# Patient Record
Sex: Male | Born: 1960 | Race: White | Hispanic: No | Marital: Single | Smoking: Never smoker
Health system: Southern US, Community
[De-identification: ages and names within clinical notes are randomized; demographics above are authoritative.]

## PROBLEM LIST (undated history)

## (undated) DIAGNOSIS — E559 Vitamin D deficiency, unspecified: Secondary | ICD-10-CM

## (undated) DIAGNOSIS — N200 Calculus of kidney: Secondary | ICD-10-CM

## (undated) DIAGNOSIS — E349 Endocrine disorder, unspecified: Secondary | ICD-10-CM

## (undated) DIAGNOSIS — T7840XA Allergy, unspecified, initial encounter: Secondary | ICD-10-CM

## (undated) DIAGNOSIS — I10 Essential (primary) hypertension: Secondary | ICD-10-CM

## (undated) DIAGNOSIS — E785 Hyperlipidemia, unspecified: Secondary | ICD-10-CM

## (undated) HISTORY — PX: BASAL CELL CARCINOMA EXCISION: SHX1214

## (undated) HISTORY — DX: Essential (primary) hypertension: I10

## (undated) HISTORY — DX: Calculus of kidney: N20.0

## (undated) HISTORY — DX: Vitamin D deficiency, unspecified: E55.9

## (undated) HISTORY — DX: Hyperlipidemia, unspecified: E78.5

## (undated) HISTORY — DX: Allergy, unspecified, initial encounter: T78.40XA

## (undated) HISTORY — DX: Endocrine disorder, unspecified: E34.9

---

## 1997-10-16 HISTORY — PX: WISDOM TOOTH EXTRACTION: SHX21

## 2007-09-02 ENCOUNTER — Ambulatory Visit (HOSPITAL_COMMUNITY): Admission: RE | Admit: 2007-09-02 | Discharge: 2007-09-02 | Payer: Self-pay | Admitting: Internal Medicine

## 2007-10-03 ENCOUNTER — Ambulatory Visit (HOSPITAL_COMMUNITY): Admission: RE | Admit: 2007-10-03 | Discharge: 2007-10-03 | Payer: Self-pay | Admitting: Internal Medicine

## 2008-03-24 ENCOUNTER — Emergency Department (HOSPITAL_BASED_OUTPATIENT_CLINIC_OR_DEPARTMENT_OTHER): Admission: EM | Admit: 2008-03-24 | Discharge: 2008-03-25 | Payer: Self-pay | Admitting: Emergency Medicine

## 2010-01-22 ENCOUNTER — Ambulatory Visit: Payer: Self-pay | Admitting: Diagnostic Radiology

## 2010-01-22 ENCOUNTER — Emergency Department (HOSPITAL_BASED_OUTPATIENT_CLINIC_OR_DEPARTMENT_OTHER): Admission: EM | Admit: 2010-01-22 | Discharge: 2010-01-22 | Payer: Self-pay | Admitting: Emergency Medicine

## 2011-01-04 LAB — URINALYSIS, ROUTINE W REFLEX MICROSCOPIC
Bilirubin Urine: NEGATIVE
Glucose, UA: NEGATIVE mg/dL
Hgb urine dipstick: NEGATIVE
Ketones, ur: NEGATIVE mg/dL
Nitrite: NEGATIVE
Protein, ur: NEGATIVE mg/dL

## 2011-07-13 LAB — URINE MICROSCOPIC-ADD ON

## 2011-07-13 LAB — URINALYSIS, ROUTINE W REFLEX MICROSCOPIC
Bilirubin Urine: NEGATIVE
Nitrite: NEGATIVE
Protein, ur: 30 — AB
Specific Gravity, Urine: 1.026
Urobilinogen, UA: 0.2

## 2011-07-13 LAB — CBC
HCT: 42.5
Hemoglobin: 15.1 — ABNORMAL HIGH
MCHC: 35.4
MCV: 87
Platelets: 132 — ABNORMAL LOW
WBC: 9.9

## 2011-07-13 LAB — BASIC METABOLIC PANEL
Creatinine, Ser: 1.2
GFR calc Af Amer: 59 — ABNORMAL LOW
GFR calc non Af Amer: 49 — ABNORMAL LOW
Potassium: 3.5

## 2011-07-13 LAB — DIFFERENTIAL
Eosinophils Relative: 0
Lymphocytes Relative: 4 — ABNORMAL LOW
Lymphs Abs: 0.4 — ABNORMAL LOW
Monocytes Absolute: 0.5

## 2011-09-21 ENCOUNTER — Encounter: Payer: Self-pay | Admitting: Internal Medicine

## 2011-09-25 ENCOUNTER — Ambulatory Visit (AMBULATORY_SURGERY_CENTER): Payer: BC Managed Care – PPO | Admitting: *Deleted

## 2011-09-25 VITALS — Ht 66.0 in | Wt 199.7 lb

## 2011-09-25 DIAGNOSIS — Z1211 Encounter for screening for malignant neoplasm of colon: Secondary | ICD-10-CM

## 2011-09-25 NOTE — Progress Notes (Signed)
Pt here for PV for direct colonoscopy.  Pt has had rectal bleeding daily w/ BM, some rectal pain when exercising and after BM for 2 months.  Pt is scheduled for colonoscopy 10/06/2011.  Colonoscopy cancelled and new patient appointment scheduled w/ Dr. Leone Payor for 10/24/2011 Sean Vasquez

## 2011-10-06 ENCOUNTER — Other Ambulatory Visit: Payer: Self-pay | Admitting: Internal Medicine

## 2011-10-19 NOTE — Progress Notes (Signed)
Addended by: Maple Hudson on: 10/19/2011 09:40 AM   Modules accepted: Level of Service

## 2011-10-24 ENCOUNTER — Ambulatory Visit: Payer: BC Managed Care – PPO | Admitting: Internal Medicine

## 2011-11-14 ENCOUNTER — Ambulatory Visit (INDEPENDENT_AMBULATORY_CARE_PROVIDER_SITE_OTHER): Payer: BC Managed Care – PPO | Admitting: Internal Medicine

## 2011-11-14 ENCOUNTER — Encounter: Payer: Self-pay | Admitting: Internal Medicine

## 2011-11-14 VITALS — BP 136/88 | HR 62 | Ht 66.0 in | Wt 200.0 lb

## 2011-11-14 DIAGNOSIS — K645 Perianal venous thrombosis: Secondary | ICD-10-CM

## 2011-11-14 DIAGNOSIS — K625 Hemorrhage of anus and rectum: Secondary | ICD-10-CM

## 2011-11-14 MED ORDER — PEG-KCL-NACL-NASULF-NA ASC-C 100 G PO SOLR: 1.0000 | Freq: Once | ORAL | Status: DC

## 2011-11-14 NOTE — Progress Notes (Signed)
Subjective:    Patient ID: Sean Vasquez, male    DOB: Jan 06, 1961, 51 y.o.   MRN: [id number]  HPI This man presents for evaluation of rectal bleeding. Sent by Dr. Oneta Rack. First noticed the bleeding in 07/2011 after returning to gym, doing cardio and weights (light). Has lifted heavier weights in past. Diagnosed with external hemorrhoids (thrombosed) 5 years ago - no bleeding. Bleeding is described as bright red blood mainly on a normal stool, less so on toilet paper. Painful defecation (anus) also. Bleeding was daily in October. Persisted into November, stopped exercising, bleeding stopped after about a month. Returned to exercise a few days ago - no bleeding but senses a tightness in the anus and also noticed a swollen hemorrhoid the week before - after squatting at work for several hours. Denies constipation, straining to stool, does not spend long time on toilet. Defecates avery AM around 0500 usually. No Known Allergies Outpatient Prescriptions Prior to Visit  Medication Sig Dispense Refill  . atenolol (TENORMIN) 100 MG tablet Take 100 mg by mouth daily.        . ergocalciferol (DRISDOL) 8000 UNIT/ML drops Take by mouth daily.        . fish oil-omega-3 fatty acids 1000 MG capsule Take 1 g by mouth daily.        . Flaxseed, Linseed, (FLAXSEED OIL) 1000 MG CAPS Take by mouth daily.        . Magnesium 300 MG CAPS Take by mouth. Takes 3 tablest daily       . Multiple Vitamin (MULTIVITAMIN) tablet Take 1 tablet by mouth daily.        . Testosterone (ANDROGEL PUMP) 20.25 MG/ACT (1.62%) GEL Place onto the skin daily.         Past Medical History  Diagnosis Date  . Hyperlipidemia   . Hypertension   . Testosterone deficiency   . Hemorrhoids   . Kidney stone    Past Surgical History  Procedure Date  . Wisdom tooth extraction 1999  . Basal cell carcinoma excision     rt. eyelid   History   Social History  . Marital Status: Single    Spouse Name: N/A    Number of Children: 0  . Years  of Education: N/A   Occupational History  . Curator    Social History Main Topics  . Smoking status: Never Smoker   . Smokeless tobacco: Never Used  . Alcohol Use: Yes     1 mixed drink per month  . Drug Use: No  .     Other Topics Concern  . None   Social History Narrative  . None   Family History  Problem Relation Age of Onset  . Colon cancer Neg Hx   . Esophageal cancer Neg Hx   . Stomach cancer Neg Hx   . Diabetes Mother   . Diabetes      Maternal grandmother  . Breast cancer      Maternal grandmother  . Lung cancer Father         Review of Systems URI few weeks ago - "cold" Fluid in left ear - started nasal steroid last week All other ROS negative or as above in HPI    Objective:   Physical Exam General:  Well-developed, well-nourished and in no acute distress Eyes:  anicteric. ENT:   Mouth and posterior pharynx free of lesions.  Neck:   supple w/o thyromegaly or mass.  Lungs: Clear to auscultation bilaterally. Heart:  S1S2, no rubs, murmurs, gallops. Abdomen:  soft, non-tender, no hepatosplenomegaly, hernia, or mass and BS+.  Rectal: Thrombosed hemorrhoid, left lateral anterior, nontender. Digital exam without mass,   formed brown stool. Anoscopy: Small hemorrhoids visible in the anal canal Lymph:  no cervical or supraclavicular adenopathy. Extremities:   no edema Skin   no rash. Neuro:  A&O x 3.  Psych:  appropriate mood and  Affect.           Assessment & Plan:   1. Hemorrhage of rectum and anus   2. Hemorrhoids, external, thrombosed    The rectal bleeding seems most likely to be from hemorrhoids. He currently has a thrombosed external hemorrhoid. There are small hemorrhoids in the anal canal. He is nearing 52. Given the rectal bleeding history, we discussed the possibility of other causes of bleeding like polyps or other gastrointestinal neoplasia and will proceed with colonoscopy. Will prepare for possible hemorrhoidal ligation at that  time as well. Risks benefits and indications of both procedures were explained to the patient he understands and agrees to proceed. Hemorrhoid handout given. He will try some sitz baths for relief of a thrombosed hemorrhoid that has not been symptomatic. He will reduce any weight bearing with his exercise routine.

## 2011-11-14 NOTE — Patient Instructions (Addendum)
Please read the hemorrhoid information provided. Modify exercise routine as we discussed, to reduce bearing down. You may find benefit from soaking in a warm bath  to relieve the thrombosed hemorrhoid. You have been scheduled for a Colonoscopy with separate instructions given. Your prep kit has been sent to your pharmacy for you to pick up.

## 2011-12-12 ENCOUNTER — Encounter (HOSPITAL_COMMUNITY)
Admission: RE | Disposition: A | Discharge status: Home / Self Care | Payer: Self-pay | Source: Ambulatory Visit | Attending: Internal Medicine

## 2011-12-12 ENCOUNTER — Ambulatory Visit (HOSPITAL_COMMUNITY)
Admission: RE | Admit: 2011-12-12 | Discharge: 2011-12-12 | Disposition: A | Discharge status: Home / Self Care | Payer: BC Managed Care – PPO | Source: Ambulatory Visit | Attending: Internal Medicine | Admitting: Internal Medicine

## 2011-12-12 ENCOUNTER — Encounter (HOSPITAL_COMMUNITY): Payer: Self-pay

## 2011-12-12 DIAGNOSIS — I10 Essential (primary) hypertension: Secondary | ICD-10-CM | POA: Insufficient documentation

## 2011-12-12 DIAGNOSIS — K645 Perianal venous thrombosis: Secondary | ICD-10-CM | POA: Insufficient documentation

## 2011-12-12 DIAGNOSIS — Z79899 Other long term (current) drug therapy: Secondary | ICD-10-CM | POA: Insufficient documentation

## 2011-12-12 DIAGNOSIS — E785 Hyperlipidemia, unspecified: Secondary | ICD-10-CM | POA: Insufficient documentation

## 2011-12-12 DIAGNOSIS — E291 Testicular hypofunction: Secondary | ICD-10-CM | POA: Insufficient documentation

## 2011-12-12 DIAGNOSIS — K644 Residual hemorrhoidal skin tags: Secondary | ICD-10-CM

## 2011-12-12 DIAGNOSIS — K625 Hemorrhage of anus and rectum: Secondary | ICD-10-CM | POA: Insufficient documentation

## 2011-12-12 HISTORY — PX: COLONOSCOPY: SHX5424

## 2011-12-12 SURGERY — COLONOSCOPY
Anesthesia: Moderate Sedation

## 2011-12-12 MED ORDER — MIDAZOLAM HCL 5 MG/5ML IJ SOLN
INTRAMUSCULAR | Status: DC | PRN
  Administered 2011-12-12 (×2): 2.5 mg via INTRAVENOUS

## 2011-12-12 MED ORDER — FENTANYL NICU IV SYRINGE 50 MCG/ML
INJECTION | INTRAMUSCULAR | Status: DC | PRN
  Administered 2011-12-12: 25 ug via INTRAVENOUS
  Administered 2011-12-12: 20 ug via INTRAVENOUS
  Administered 2011-12-12: 25 ug via INTRAVENOUS

## 2011-12-12 MED ORDER — FENTANYL CITRATE 0.05 MG/ML IJ SOLN
INTRAMUSCULAR | Status: AC
  Filled 2011-12-12: qty 2

## 2011-12-12 MED ORDER — MIDAZOLAM HCL 10 MG/2ML IJ SOLN
INTRAMUSCULAR | Status: AC
  Filled 2011-12-12: qty 2

## 2011-12-12 MED ORDER — SODIUM CHLORIDE 0.9 % IV SOLN
INTRAVENOUS | Status: DC
  Administered 2011-12-12: 500 mL via INTRAVENOUS

## 2011-12-12 NOTE — Op Note (Signed)
[facility] [address] [place], [place]  [address]  COLONOSCOPY PROCEDURE REPORT  PATIENT:  Sean Vasquez, Sean Vasquez  MR#:  [id number] BIRTHDATE:  08-Feb-1961, 50 yrs. old  GENDER:  male ENDOSCOPIST:  Iva Boop, MD, [facility] REF. BY:  Lucky Cowboy, M.D. PROCEDURE DATE:  12/12/2011 PROCEDURE:  Colonoscopy 19147 ASA CLASS:  Class II INDICATIONS:  rectal bleeding MEDICATIONS:   Fentanyl 60 mcg IV, Versed 5 mg IV  DESCRIPTION OF PROCEDURE:   After the risks benefits and alternatives of the procedure were thoroughly explained, informed consent was obtained.  Digital rectal exam was performed and revealed external hemorrhoids and normal prostate.  Thrombosed external hemorrhoid - soft and nontender. The 110536 endoscope was introduced through the anus and advanced to the cecum, which was identified by both the appendix and ileocecal valve, without limitations.  The quality of the prep was excellent, using MoviPrep.  The instrument was then slowly withdrawn as the colon was fully examined. <<PROCEDUREIMAGES>>  FINDINGS:  A normal appearing cecum, ileocecal valve, and appendiceal orifice were identified. The ascending, hepatic flexure, transverse, splenic flexure, descending, sigmoid colon, and rectum appeared unremarkable.  External Hemorrhoids were found. Small thrombosed external hemorrhoid.   Retroflexed views in the rectum revealed no abnormalities.    The time to cecum = 3 minutes. The scope was then withdrawn in 11 minutes from the cecum and the procedure completed. COMPLICATIONS:  None ENDOSCOPIC IMPRESSION: 1) Normal colon 2) External hemorrhoids - small thrombosed hemorrhoid. RECOMMENDATIONS: Things seem better but I think his hemorrhoids are more of an external problem - if recurrent bleeding problems I recommend he be evaluated by a surgeon. REPEAT EXAM:  In 10 year(s) for routine screening colonoscopy.  Iva Boop, MD, [facility]  CC:  The Patient and Lucky Cowboy, MD  n. Rosalie Doctor:   Iva Boop at 12/12/2011 12:21 PM  Benjiman Core, [phone]

## 2011-12-12 NOTE — Interval H&P Note (Signed)
History and Physical Interval Note:  12/12/2011 11:29 AM  Sean Vasquez  has presented today for surgery, with the diagnosis of hemorrhoids  The various methods of treatment have been discussed with the patient and family. After consideration of risks, benefits and other options for treatment, the patient has consented to  Procedure(s) (LRB): COLONOSCOPY (N/A) as a surgical intervention .  The patients' history has been reviewed, patient examined, no change in status, stable for surgery.  I have reviewed the patients' chart and labs.  Questions were answered to the patient's satisfaction.     Stan Head

## 2011-12-12 NOTE — Discharge Instructions (Addendum)
The hemorrhoids are mostly if not all external. Banding will not work for them. They were minimal at this time, I think. Further treatment of these, if recurrent problems and desired would be best served by seeing a Careers adviser. Iva Boop, MD, [facility] Endoscopy Care After Please read the instructions outlined below and refer to this sheet in the next few weeks. These discharge instructions provide you with general information on caring for yourself after you leave the hospital. Your doctor may also give you specific instructions. While your treatment has been planned according to the most current medical practices available, unavoidable complications occasionally occur. If you have any problems or questions after discharge, please call your doctor. HOME CARE INSTRUCTIONS Activity  You may resume your regular activity but move at a slower pace for the next 24 hours.   Take frequent rest periods for the next 24 hours.   Walking will help expel (get rid of) the air and reduce the bloated feeling in your abdomen.   No driving for 24 hours (because of the anesthesia (medicine) used during the test).   You may shower.   Do not sign any important legal documents or operate any machinery for 24 hours (because of the anesthesia used during the test).  Nutrition  Drink plenty of fluids.   You may resume your normal diet.   Begin with a light meal and progress to your normal diet.   Avoid alcoholic beverages for 24 hours or as instructed by your caregiver.  Medications You may resume your normal medications unless your caregiver tells you otherwise. What you can expect today  You may experience abdominal discomfort such as a feeling of fullness or "gas" pains.   You may experience a sore throat for 2 to 3 days. This is normal. Gargling with salt water may help this.  Follow-up Your doctor will discuss the results of your test with you. SEEK IMMEDIATE MEDICAL CARE IF:  You have excessive  nausea (feeling sick to your stomach) and/or vomiting.   You have severe abdominal pain and distention (swelling).   You have trouble swallowing.   You have a temperature over 100 F (37.8 C).   You have rectal bleeding or vomiting of blood.  Document Released: 05/16/2004 Document Revised: 06/14/2011 Document Reviewed: 11/27/2007 [facility] Patient Information 2012 [place], [place].

## 2011-12-12 NOTE — H&P (View-Only) (Signed)
Subjective:    Patient ID: Sean Vasquez, male    DOB: 06/16/1961, 50 y.o.   MRN: [id number]  HPI This man presents for evaluation of rectal bleeding. Sent by Dr. McKeown. First noticed the bleeding in 07/2011 after returning to gym, doing cardio and weights (light). Has lifted heavier weights in past. Diagnosed with external hemorrhoids (thrombosed) 5 years ago - no bleeding. Bleeding is described as bright red blood mainly on a normal stool, less so on toilet paper. Painful defecation (anus) also. Bleeding was daily in October. Persisted into November, stopped exercising, bleeding stopped after about a month. Returned to exercise a few days ago - no bleeding but senses a tightness in the anus and also noticed a swollen hemorrhoid the week before - after squatting at work for several hours. Denies constipation, straining to stool, does not spend long time on toilet. Defecates avery AM around 0500 usually. No Known Allergies Outpatient Prescriptions Prior to Visit  Medication Sig Dispense Refill  . atenolol (TENORMIN) 100 MG tablet Take 100 mg by mouth daily.        . ergocalciferol (DRISDOL) 8000 UNIT/ML drops Take by mouth daily.        . fish oil-omega-3 fatty acids 1000 MG capsule Take 1 g by mouth daily.        . Flaxseed, Linseed, (FLAXSEED OIL) 1000 MG CAPS Take by mouth daily.        . Magnesium 300 MG CAPS Take by mouth. Takes 3 tablest daily       . Multiple Vitamin (MULTIVITAMIN) tablet Take 1 tablet by mouth daily.        . Testosterone (ANDROGEL PUMP) 20.25 MG/ACT (1.62%) GEL Place onto the skin daily.         Past Medical History  Diagnosis Date  . Hyperlipidemia   . Hypertension   . Testosterone deficiency   . Hemorrhoids   . Kidney stone    Past Surgical History  Procedure Date  . Wisdom tooth extraction 1999  . Basal cell carcinoma excision     rt. eyelid   History   Social History  . Marital Status: Single    Spouse Name: N/A    Number of Children: 0  . Years  of Education: N/A   Occupational History  . mechanic    Social History Main Topics  . Smoking status: Never Smoker   . Smokeless tobacco: Never Used  . Alcohol Use: Yes     1 mixed drink per month  . Drug Use: No  .     Other Topics Concern  . None   Social History Narrative  . None   Family History  Problem Relation Age of Onset  . Colon cancer Neg Hx   . Esophageal cancer Neg Hx   . Stomach cancer Neg Hx   . Diabetes Mother   . Diabetes      Maternal grandmother  . Breast cancer      Maternal grandmother  . Lung cancer Father         Review of Systems URI few weeks ago - "cold" Fluid in left ear - started nasal steroid last week All other ROS negative or as above in HPI    Objective:   Physical Exam General:  Well-developed, well-nourished and in no acute distress Eyes:  anicteric. ENT:   Mouth and posterior pharynx free of lesions.  Neck:   supple w/o thyromegaly or mass.  Lungs: Clear to auscultation bilaterally. Heart:    S1S2, no rubs, murmurs, gallops. Abdomen:  soft, non-tender, no hepatosplenomegaly, hernia, or mass and BS+.  Rectal: Thrombosed hemorrhoid, left lateral anterior, nontender. Digital exam without mass,   formed brown stool. Anoscopy: Small hemorrhoids visible in the anal canal Lymph:  no cervical or supraclavicular adenopathy. Extremities:   no edema Skin   no rash. Neuro:  A&O x 3.  Psych:  appropriate mood and  Affect.           Assessment & Plan:   1. Hemorrhage of rectum and anus   2. Hemorrhoids, external, thrombosed    The rectal bleeding seems most likely to be from hemorrhoids. He currently has a thrombosed external hemorrhoid. There are small hemorrhoids in the anal canal. He is nearing 52. Given the rectal bleeding history, we discussed the possibility of other causes of bleeding like polyps or other gastrointestinal neoplasia and will proceed with colonoscopy. Will prepare for possible hemorrhoidal ligation at that  time as well. Risks benefits and indications of both procedures were explained to the patient he understands and agrees to proceed. Hemorrhoid handout given. He will try some sitz baths for relief of a thrombosed hemorrhoid that has not been symptomatic. He will reduce any weight bearing with his exercise routine.  

## 2011-12-13 ENCOUNTER — Encounter (HOSPITAL_COMMUNITY): Payer: Self-pay | Admitting: Internal Medicine

## 2011-12-29 ENCOUNTER — Encounter (HOSPITAL_COMMUNITY): Payer: Self-pay | Admitting: Emergency Medicine

## 2011-12-29 ENCOUNTER — Emergency Department (HOSPITAL_COMMUNITY): Payer: BC Managed Care – PPO

## 2011-12-29 ENCOUNTER — Emergency Department (HOSPITAL_COMMUNITY)
Admission: EM | Admit: 2011-12-29 | Discharge: 2011-12-29 | Disposition: A | Discharge status: Home / Self Care | Payer: BC Managed Care – PPO | Attending: Emergency Medicine | Admitting: Emergency Medicine

## 2011-12-29 DIAGNOSIS — M549 Dorsalgia, unspecified: Secondary | ICD-10-CM | POA: Insufficient documentation

## 2011-12-29 DIAGNOSIS — R110 Nausea: Secondary | ICD-10-CM | POA: Insufficient documentation

## 2011-12-29 DIAGNOSIS — E785 Hyperlipidemia, unspecified: Secondary | ICD-10-CM | POA: Insufficient documentation

## 2011-12-29 DIAGNOSIS — R109 Unspecified abdominal pain: Secondary | ICD-10-CM | POA: Insufficient documentation

## 2011-12-29 DIAGNOSIS — R300 Dysuria: Secondary | ICD-10-CM | POA: Insufficient documentation

## 2011-12-29 DIAGNOSIS — I10 Essential (primary) hypertension: Secondary | ICD-10-CM | POA: Insufficient documentation

## 2011-12-29 DIAGNOSIS — Z87442 Personal history of urinary calculi: Secondary | ICD-10-CM | POA: Insufficient documentation

## 2011-12-29 DIAGNOSIS — N201 Calculus of ureter: Secondary | ICD-10-CM | POA: Insufficient documentation

## 2011-12-29 DIAGNOSIS — N200 Calculus of kidney: Secondary | ICD-10-CM

## 2011-12-29 LAB — CBC
HCT: 43.8 % (ref 39.0–52.0)
Hemoglobin: 15.7 g/dL (ref 13.0–17.0)
MCH: 30.6 pg (ref 26.0–34.0)
MCHC: 35.8 g/dL (ref 30.0–36.0)
MCV: 85.4 fL (ref 78.0–100.0)
Platelets: 210 10*3/uL (ref 150–400)
RBC: 5.13 MIL/uL (ref 4.22–5.81)
RDW: 13 % (ref 11.5–15.5)
WBC: 9.7 10*3/uL (ref 4.0–10.5)

## 2011-12-29 LAB — URINALYSIS, ROUTINE W REFLEX MICROSCOPIC
Bilirubin Urine: NEGATIVE
Glucose, UA: NEGATIVE mg/dL
Ketones, ur: NEGATIVE mg/dL
Leukocytes, UA: NEGATIVE
Nitrite: NEGATIVE
Protein, ur: NEGATIVE mg/dL
Specific Gravity, Urine: 1.018 (ref 1.005–1.030)
Urobilinogen, UA: 0.2 mg/dL (ref 0.0–1.0)
pH: 5.5 (ref 5.0–8.0)

## 2011-12-29 LAB — BASIC METABOLIC PANEL
BUN: 23 mg/dL (ref 6–23)
CO2: 27 mEq/L (ref 19–32)
Calcium: 9.4 mg/dL (ref 8.4–10.5)
Chloride: 101 mEq/L (ref 96–112)
Creatinine, Ser: 1.84 mg/dL — ABNORMAL HIGH (ref 0.50–1.35)
GFR calc Af Amer: 48 mL/min — ABNORMAL LOW (ref >90)
GFR calc non Af Amer: 42 mL/min — ABNORMAL LOW (ref >90)
Glucose, Bld: 92 mg/dL (ref 70–99)
Potassium: 4.3 mEq/L (ref 3.5–5.1)
Sodium: 136 mEq/L (ref 135–145)

## 2011-12-29 LAB — URINE MICROSCOPIC-ADD ON

## 2011-12-29 LAB — DIFFERENTIAL
Basophils Absolute: 0 10*3/uL (ref 0.0–0.1)
Basophils Relative: 0 % (ref 0–1)
Eosinophils Absolute: 0.1 10*3/uL (ref 0.0–0.7)
Eosinophils Relative: 1 % (ref 0–5)
Lymphocytes Relative: 19 % (ref 12–46)
Lymphs Abs: 1.8 10*3/uL (ref 0.7–4.0)
Monocytes Absolute: 0.7 10*3/uL (ref 0.1–1.0)
Monocytes Relative: 7 % (ref 3–12)
Neutro Abs: 7.1 10*3/uL (ref 1.7–7.7)
Neutrophils Relative %: 73 % (ref 43–77)

## 2011-12-29 MED ORDER — OXYCODONE-ACETAMINOPHEN 5-325 MG PO TABS: 1.0000 | ORAL_TABLET | ORAL | Status: AC | PRN

## 2011-12-29 MED ORDER — ONDANSETRON HCL 4 MG/2ML IJ SOLN
4.0000 mg | Freq: Once | INTRAMUSCULAR | Status: AC
  Administered 2011-12-29: 4 mg via INTRAVENOUS
  Filled 2011-12-29: qty 2

## 2011-12-29 MED ORDER — HYDROMORPHONE HCL PF 1 MG/ML IJ SOLN
1.0000 mg | Freq: Once | INTRAMUSCULAR | Status: AC
  Administered 2011-12-29: 1 mg via INTRAVENOUS
  Filled 2011-12-29: qty 1

## 2011-12-29 NOTE — ED Provider Notes (Signed)
History     CSN: [id number]  Arrival date & time 12/29/11  1103   First MD Initiated Contact with Patient 12/29/11 1136      Chief Complaint  Patient presents with  . Abdominal Pain  . Flank Pain    (Consider location/radiation/quality/duration/timing/severity/associated sxs/prior treatment) HPI Comments: Patient here from home who reports that he began to have right flank and back pain starting 2 days ago - he called his PCP and saw him who started him on pain control and flomax - states that this initially helped the pain but starting last night he reports that the pain moved to the RLQ of his abdomen.  States nausea but no vomiting, denies gross hematuria, reports decrease in flow of urine, states pain medication no longer helping with the pain - states past history of kidney stones without having a urologist involved.  States had colonoscopy 2 weeks ago without any complications.  Patient is a 51 y.o. male presenting with abdominal pain and flank pain. The history is provided by the patient. No language interpreter was used.  Abdominal Pain The primary symptoms of the illness include abdominal pain, nausea and dysuria. The primary symptoms of the illness do not include fever, fatigue, shortness of breath, vomiting, diarrhea, hematemesis or hematochezia. The current episode started yesterday. The onset of the illness was sudden. The problem has been gradually worsening.  The dysuria is not associated with hematuria, frequency or urgency.  The patient states that she believes she is currently not pregnant. The patient has not had a change in bowel habit. Additional symptoms associated with the illness include anorexia and back pain. Symptoms associated with the illness do not include chills, diaphoresis, heartburn, constipation, urgency, hematuria or frequency.  Flank Pain Associated symptoms include abdominal pain, anorexia and nausea. Pertinent negatives include no chills, diaphoresis,  fatigue, fever or vomiting.    Past Medical History  Diagnosis Date  . Hyperlipidemia   . Hypertension   . Testosterone deficiency   . Hemorrhoids   . Kidney stone     Past Surgical History  Procedure Date  . Wisdom tooth extraction 1999  . Basal cell carcinoma excision     rt. eyelid  . Colonoscopy 12/12/2011    Procedure: COLONOSCOPY;  Surgeon: Iva Boop, MD;  Location: [facility];  Service: Endoscopy;  Laterality: N/A;  with possible hemorrhoid banding    Family History  Problem Relation Age of Onset  . Colon cancer Neg Hx   . Esophageal cancer Neg Hx   . Stomach cancer Neg Hx   . Diabetes Mother   . Diabetes      Maternal grandmother  . Breast cancer      Maternal grandmother  . Lung cancer Father     History  Substance Use Topics  . Smoking status: Never Smoker   . Smokeless tobacco: Never Used  . Alcohol Use: Yes     1 mixed drink per month      Review of Systems  Constitutional: Negative for fever, chills, diaphoresis and fatigue.  Respiratory: Negative for shortness of breath.   Gastrointestinal: Positive for nausea, abdominal pain and anorexia. Negative for heartburn, vomiting, diarrhea, constipation, hematochezia and hematemesis.  Genitourinary: Positive for dysuria and flank pain. Negative for urgency, frequency and hematuria.  Musculoskeletal: Positive for back pain.  All other systems reviewed and are negative.    Allergies  Review of patient's allergies indicates no known allergies.  Home Medications   Current Outpatient Rx  Name  Route Sig Dispense Refill  . ASPIRIN 81 MG PO CHEW Oral Chew 81 mg by mouth daily.    . ATENOLOL 100 MG PO TABS Oral Take 100 mg by mouth daily.      . ERGOCALCIFEROL 8000 UNIT/ML PO SOLN Oral Take by mouth daily.      . OMEGA-3 FATTY ACIDS 1000 MG PO CAPS Oral Take 1 g by mouth daily.      Marland Kitchen FLAXSEED OIL 1000 MG PO CAPS Oral Take by mouth daily.      Marland Kitchen HYDROCODONE-ACETAMINOPHEN 5-325 MG PO TABS Oral Take  3 tablets by mouth every 6 (six) hours as needed. For pain    . HYDROCORTISONE 2.5 % RE CREA Rectal Place rectally as needed.    Marland Kitchen MAGNESIUM 300 MG PO CAPS Oral Take by mouth. Takes 3 tablest daily     . ONE-DAILY MULTI VITAMINS PO TABS Oral Take 1 tablet by mouth daily.      Marland Kitchen TAMSULOSIN HCL 0.4 MG PO CAPS Oral Take 0.4 mg by mouth.    . TESTOSTERONE 20.25 MG/ACT (1.62%) TD GEL Transdermal Place onto the skin daily.        BP 126/66  Pulse 88  Temp 97.6 F (36.4 C)  Resp 20  SpO2 100%  Physical Exam  Nursing note and vitals reviewed. Constitutional: He is oriented to person, place, and time. He appears well-developed and well-nourished. No distress.  HENT:  Head: Normocephalic and atraumatic.  Right Ear: External ear normal.  Left Ear: External ear normal.  Nose: Nose normal.  Mouth/Throat: Oropharynx is clear and moist. No oropharyngeal exudate.  Eyes: Conjunctivae are normal. Pupils are equal, round, and reactive to light. No scleral icterus.  Neck: Normal range of motion. Neck supple.  Cardiovascular: Normal rate, regular rhythm and normal heart sounds.  Exam reveals no gallop and no friction rub.   No murmur heard. Pulmonary/Chest: Effort normal and breath sounds normal. No respiratory distress. He exhibits no tenderness.  Abdominal: Soft. Bowel sounds are normal. He exhibits no distension and no mass. There is tenderness. There is guarding and CVA tenderness. There is no rebound.    Genitourinary: Penis normal. No penile tenderness.  Musculoskeletal: Normal range of motion. He exhibits no edema and no tenderness.  Lymphadenopathy:    He has no cervical adenopathy.  Neurological: He is alert and oriented to person, place, and time. No cranial nerve deficit.  Skin: Skin is warm and dry. No rash noted. No erythema. No pallor.  Psychiatric: He has a normal mood and affect. His behavior is normal. Judgment and thought content normal.    ED Course  Procedures (including  critical care time)   Labs Reviewed  URINALYSIS, ROUTINE W REFLEX MICROSCOPIC  CBC  DIFFERENTIAL  BASIC METABOLIC PANEL   No results found. Results for orders placed during the hospital encounter of 12/29/11  URINALYSIS, ROUTINE W REFLEX MICROSCOPIC      Component Value Range   Color, Urine YELLOW  YELLOW    APPearance CLEAR  CLEAR    Specific Gravity, Urine 1.018  1.005 - 1.030    pH 5.5  5.0 - 8.0    Glucose, UA NEGATIVE  NEGATIVE (mg/dL)   Hgb urine dipstick SMALL (*) NEGATIVE    Bilirubin Urine NEGATIVE  NEGATIVE    Ketones, ur NEGATIVE  NEGATIVE (mg/dL)   Protein, ur NEGATIVE  NEGATIVE (mg/dL)   Urobilinogen, UA 0.2  0.0 - 1.0 (mg/dL)   Nitrite NEGATIVE  NEGATIVE  Leukocytes, UA NEGATIVE  NEGATIVE   CBC      Component Value Range   WBC 9.7  4.0 - 10.5 (K/uL)   RBC 5.13  4.22 - 5.81 (MIL/uL)   Hemoglobin 15.7  13.0 - 17.0 (g/dL)   HCT 16.1  09.6 - 04.5 (%)   MCV 85.4  78.0 - 100.0 (fL)   MCH 30.6  26.0 - 34.0 (pg)   MCHC 35.8  30.0 - 36.0 (g/dL)   RDW 40.9  81.1 - 91.4 (%)   Platelets 210  150 - 400 (K/uL)  DIFFERENTIAL      Component Value Range   Neutrophils Relative 73  43 - 77 (%)   Neutro Abs 7.1  1.7 - 7.7 (K/uL)   Lymphocytes Relative 19  12 - 46 (%)   Lymphs Abs 1.8  0.7 - 4.0 (K/uL)   Monocytes Relative 7  3 - 12 (%)   Monocytes Absolute 0.7  0.1 - 1.0 (K/uL)   Eosinophils Relative 1  0 - 5 (%)   Eosinophils Absolute 0.1  0.0 - 0.7 (K/uL)   Basophils Relative 0  0 - 1 (%)   Basophils Absolute 0.0  0.0 - 0.1 (K/uL)  BASIC METABOLIC PANEL      Component Value Range   Sodium 136  135 - 145 (mEq/L)   Potassium 4.3  3.5 - 5.1 (mEq/L)   Chloride 101  96 - 112 (mEq/L)   CO2 27  19 - 32 (mEq/L)   Glucose, Bld 92  70 - 99 (mg/dL)   BUN 23  6 - 23 (mg/dL)   Creatinine, Ser 7.82 (*) 0.50 - 1.35 (mg/dL)   Calcium 9.4  8.4 - 95.6 (mg/dL)   GFR calc non Af Amer 42 (*) >90 (mL/min)   GFR calc Af Amer 48 (*) >90 (mL/min)  URINE MICROSCOPIC-ADD ON       Component Value Range   WBC, UA 0-2  <3 (WBC/hpf)   Ct Abdomen Pelvis Wo Contrast  12/29/2011  *RADIOLOGY REPORT*  Clinical Data: Right flank pain.  CT ABDOMEN AND PELVIS WITHOUT CONTRAST  Technique:  Multidetector CT imaging of the abdomen and pelvis was performed following the standard protocol without intravenous contrast.  Comparison: 01/22/2010  Findings: No focal abnormalities seen in the liver or spleen.  The stomach, duodenum, pancreas, gallbladder, and adrenal glands are unremarkable.  5 mm nonobstructing stone is seen in the interpolar left kidney.  There is mild right hydronephrosis with prominent right perinephric edema/fluid.  3 x 2 x 4 mm stone is identified in the distal right ureter about 1 - 2 cm proximal to the UVJ.  There is a fairly substantial amount of fluid in the right retroperitoneal tissues, tracking down towards the pelvis. Calyceal rupture would be a consideration.  No abdominal aortic aneurysm.  No abdominal lymphadenopathy.  Imaging through the pelvis shows no free intraperitoneal fluid. There is edema/fluid in the pelvic floor.  This has most likely tracked down from the right kidney/ureter.  Bladder is unremarkable without evidence for bladder stones.  No colonic diverticulitis. The terminal ileum is normal.  Visualized portions of the appendix are normal.  Bone windows reveal no worrisome lytic or sclerotic osseous lesions.  IMPRESSION: 3 x 2 x 4 mm stone in the distal right ureter is associated with mild right hydronephrosis and moderate perinephric and periureteric edema/fluid.  Original Report Authenticated By: ERIC A. MANSELL, M.D.      Right ureteral stone   MDM  Patient with  remote history of kidney stones presents with right flank and abdominal pain - now with 3.x.2.x4 mm stone almost to the right UVJ - there is mild right hydronephrosis and fluid.  Plan to increase pain medication, continue him on the flomax, no appearance of infection, and follow up with Dr.  Laverle Patter - Dr. Laverle Patter in to see the patient in the ER due to the increase in Cr.  To 1.84 - patient will follow up with him this coming week.        Izola Price [place], [place] 12/29/11 1545

## 2011-12-29 NOTE — ED Notes (Signed)
Hx kideny stones states that he has abd pain flank pain to the rt side some urine output but minimal approx 1 hr ago, nausea

## 2011-12-29 NOTE — Discharge Instructions (Signed)
Kidney Stones Kidney stones (ureteral lithiasis) are solid masses that form inside your kidneys. The intense pain is caused by the stone moving through the kidney, ureter, bladder, and urethra (urinary tract). When the stone moves, the ureter starts to spasm around the stone. The stone is usually passed in the urine.  HOME CARE  Drink enough fluids to keep your pee (urine) clear or pale yellow. This helps to get the stone out.   Strain all pee through the provided strainer. Do not pee without peeing through the strainer, not even once. If you pee the stone out, catch it. The stone may be as small as a grain of salt. Take this to your doctor.   Only take medicine as told by your doctor.   Follow up with your doctor as told.   Get follow-up X-rays as told by your doctor.  GET HELP RIGHT AWAY IF:   Your pain does not get better with medicine.   You have a fever.   Your pain increases and gets worse over 18 hours.   You have new belly (abdominal) pain.   You feel faint or pass out.  MAKE SURE YOU:   Understand these instructions.   Will watch your condition.   Will get help right away if you are not doing well or get worse.  Document Released: 03/20/2008 Document Revised: 09/21/2011 Document Reviewed: 07/30/2009 [facility] Patient Information 2012 [place], [place].Kidney Stones Kidney stones (ureteral lithiasis) are deposits that form inside your kidneys. The intense pain is caused by the stone moving through the urinary tract. When the stone moves, the ureter goes into spasm around the stone. The stone is usually passed in the urine.  CAUSES   A disorder that makes certain neck glands produce too much parathyroid hormone (primary hyperparathyroidism).   A buildup of uric acid crystals.   Narrowing (stricture) of the ureter.   A kidney obstruction present at birth (congenital obstruction).   Previous surgery on the kidney or ureters.   Numerous kidney infections.  SYMPTOMS    Feeling sick to your stomach (nauseous).   Throwing up (vomiting).   Blood in the urine (hematuria).   Pain that usually spreads (radiates) to the groin.   Frequency or urgency of urination.  DIAGNOSIS   Taking a history and physical exam.   Blood or urine tests.   Computerized X-ray scan (CT scan).   Occasionally, an examination of the inside of the urinary bladder (cystoscopy) is performed.  TREATMENT   Observation.   Increasing your fluid intake.   Surgery may be needed if you have severe pain or persistent obstruction.  The size, location, and chemical composition are all important variables that will determine the proper choice of action for you. Talk to your caregiver to better understand your situation so that you will minimize the risk of injury to yourself and your kidney.  HOME CARE INSTRUCTIONS   Drink enough water and fluids to keep your urine clear or pale yellow.   Strain all urine through the provided strainer. Keep all particulate matter and stones for your caregiver to see. The stone causing the pain may be as small as a grain of salt. It is very important to use the strainer each and every time you pass your urine. The collection of your stone will allow your caregiver to analyze it and verify that a stone has actually passed.   Only take over-the-counter or prescription medicines for pain, discomfort, or fever as directed by your caregiver.  Make a follow-up appointment with your caregiver as directed.   Get follow-up X-rays if required. The absence of pain does not always mean that the stone has passed. It may have only stopped moving. If the urine remains completely obstructed, it can cause loss of kidney function or even complete destruction of the kidney. It is your responsibility to make sure X-rays and follow-ups are completed. Ultrasounds of the kidney can show blockages and the status of the kidney. Ultrasounds are not associated with any radiation  and can be performed easily in a matter of minutes.  SEEK IMMEDIATE MEDICAL CARE IF:   Pain cannot be controlled with the prescribed medicine.   You have a fever.   The severity or intensity of pain increases over 18 hours and is not relieved by pain medicine.   You develop a new onset of abdominal pain.   You feel faint or pass out.  MAKE SURE YOU:   Understand these instructions.   Will watch your condition.   Will get help right away if you are not doing well or get worse.  Document Released: 10/02/2005 Document Revised: 09/21/2011 Document Reviewed: 01/28/2010 [facility] Patient Information 2012 [place], [place].Ureteral Colic (Kidney Stones) Ureteral colic is the result of a condition when kidney stones form inside the kidney. Once kidney stones are formed they may move into the tube that connects the kidney with the bladder (ureter). If this occurs, this condition may cause pain (colic) in the ureter.  CAUSES  Pain is caused by stone movement in the ureter and the obstruction caused by the stone. SYMPTOMS  The pain comes and goes as the ureter contracts around the stone. The pain is usually intense, sharp, and stabbing in character. The location of the pain may move as the stone moves through the ureter. When the stone is near the kidney the pain is usually located in the back and radiates to the belly (abdomen). When the stone is ready to pass into the bladder the pain is often located in the lower abdomen on the side the stone is located. At this location, the symptoms may mimic those of a urinary tract infection with urinary frequency. Once the stone is located here it often passes into the bladder and the pain disappears completely. TREATMENT   Your caregiver will provide you with medicine for pain relief.   You may require specialized follow-up X-rays.   The absence of pain does not always mean that the stone has passed. It may have just stopped moving. If the urine remains  completely obstructed, it can cause loss of kidney function or even complete destruction of the involved kidney. It is your responsibility and in your interest that X-rays and follow-ups as suggested by your caregiver are completed. Relief of pain without passage of the stone can be associated with severe damage to the kidney, including loss of kidney function on that side.   If your stone does not pass on its own, additional measures may be taken by your caregiver to ensure its removal.  HOME CARE INSTRUCTIONS   Increase your fluid intake. Water is the preferred fluid since juices containing vitamin C may acidify the urine making it less likely for certain stones (uric acid stones) to pass.   Strain all urine. A strainer will be provided. Keep all particulate matter or stones for your caregiver to inspect.   Take your pain medicine as directed.   Make a follow-up appointment with your caregiver as directed.   Remember  that the goal is passage of your stone. The absence of pain does not mean the stone is gone. Follow your caregiver's instructions.   Only take over-the-counter or prescription medicines for pain, discomfort, or fever as directed by your caregiver.  SEEK MEDICAL CARE IF:   Pain cannot be controlled with the prescribed medicine.   You have a fever.   Pain continues for longer than your caregiver advises it should.   There is a change in the pain, and you develop chest discomfort or constant abdominal pain.   You feel faint or pass out.  MAKE SURE YOU:   Understand these instructions.   Will watch your condition.   Will get help right away if you are not doing well or get worse.  Document Released: 07/12/2005 Document Revised: 09/21/2011 Document Reviewed: 03/29/2011 [facility] Patient Information 2012 [place], [place].

## 2011-12-29 NOTE — Consult Note (Signed)
Urology Consult   Physician requesting consult: Dr. Raeford Razor  Reason for consult: Left ureteral stone  History of Present Illness: Sean Vasquez is a 51 y.o. with 2 days of severe left flank and left lower quadrant pain with associated nausea.  He denies vomiting or fever.  He has a history of one other stone episode 2 years ago and passed that stone.  A CT in the ED today indicates a 3 mm distal left ureteral stone with fluid in the retroperitoneum consistent with fornicel rupture.  He denies a history of voiding or storage urinary symptoms, hematuria, UTIs, STDs, GU malignancy/trauma/surgery.  Past Medical History  Diagnosis Date  . Hyperlipidemia   . Hypertension   . Testosterone deficiency   . Hemorrhoids   . Kidney stone     Past Surgical History  Procedure Date  . Wisdom tooth extraction 1999  . Basal cell carcinoma excision     rt. eyelid  . Colonoscopy 12/12/2011    Procedure: COLONOSCOPY;  Surgeon: Iva Boop, MD;  Location: [facility];  Service: Endoscopy;  Laterality: N/A;  with possible hemorrhoid banding    Current Hospital Medications: Prior to Admission medications   Medication Sig Start Date End Date Taking? Authorizing Provider  aspirin 81 MG chewable tablet Chew 81 mg by mouth daily.   Yes Historical Provider, MD  atenolol (TENORMIN) 100 MG tablet Take 100 mg by mouth daily.     Yes Historical Provider, MD  ergocalciferol (DRISDOL) 8000 UNIT/ML drops Take by mouth daily.     Yes Historical Provider, MD  fish oil-omega-3 fatty acids 1000 MG capsule Take 1 g by mouth daily.     Yes Historical Provider, MD  Flaxseed, Linseed, (FLAXSEED OIL) 1000 MG CAPS Take by mouth daily.     Yes Historical Provider, MD  HYDROcodone-acetaminophen (NORCO) 5-325 MG per tablet Take 3 tablets by mouth every 6 (six) hours as needed. For pain   Yes Historical Provider, MD  hydrocortisone (PROCTOZONE-HC) 2.5 % rectal cream Place rectally as needed.   Yes Historical  Provider, MD  Magnesium 300 MG CAPS Take by mouth. Takes 3 tablest daily    Yes Historical Provider, MD  Multiple Vitamin (MULTIVITAMIN) tablet Take 1 tablet by mouth daily.     Yes Historical Provider, MD  Tamsulosin HCl (FLOMAX) 0.4 MG CAPS Take 0.4 mg by mouth.   Yes Historical Provider, MD  Testosterone (ANDROGEL PUMP) 20.25 MG/ACT (1.62%) GEL Place onto the skin daily.     Yes Historical Provider, MD  oxyCODONE-acetaminophen (PERCOCET) 5-325 MG per tablet Take 1 tablet by mouth every 4 (four) hours as needed for pain. 12/29/11 01/08/12  Izola Price. [place], [place]     Scheduled Meds:   .  HYDROmorphone (DILAUDID) injection  1 mg Intravenous Once  .  HYDROmorphone (DILAUDID) injection  1 mg Intravenous Once  . ondansetron (ZOFRAN) IV  4 mg Intravenous Once  . ondansetron (ZOFRAN) IV  4 mg Intravenous Once   Continuous Infusions:  PRN Meds:.  Allergies: No Known Allergies  Family History  Problem Relation Age of Onset  . Colon cancer Neg Hx   . Esophageal cancer Neg Hx   . Stomach cancer Neg Hx   . Diabetes Mother   . Diabetes      Maternal grandmother  . Breast cancer      Maternal grandmother  . Lung cancer Father     Social History:  reports that he has never smoked. He has never used smokeless  tobacco. He reports that he drinks alcohol. He reports that he does not use illicit drugs.  ROS: A complete review of systems was performed.  All systems are negative except for pertinent findings as noted.  Physical Exam:  Vital signs in last 24 hours: Temp:  [97.6 F (36.4 C)-99.5 F (37.5 C)] 99.5 F (37.5 C) (03/15 1442) Pulse Rate:  [67-88] 67  (03/15 1442) Resp:  [18-20] 18  (03/15 1442) BP: (126-151)/(66-77) 151/77 mmHg (03/15 1442) SpO2:  [92 %-100 %] 92 % (03/15 1442) General:  Alert and oriented, No acute distress HEENT: Normocephalic, atraumatic Neck: No JVD or lymphadenopathy Cardiovascular: Regular rate and rhythm Lungs: Normal respiratory effort Abdomen:  Slightly distended, moderate LLQ tenderness, no abdominal masses Extremities: No edema Neurologic: Grossly intact  Laboratory Data:   Basename 12/29/11 1145  WBC 9.7  HGB 15.7  HCT 43.8  PLT 210     Basename 12/29/11 1145  NA 136  K 4.3  CL 101  GLUCOSE 92  BUN 23  CALCIUM 9.4  CREATININE 1.84*   Baseline Cr 1.2  Results for orders placed during the hospital encounter of 12/29/11 (from the past 24 hour(s))  URINALYSIS, ROUTINE W REFLEX MICROSCOPIC     Status: Abnormal   Collection Time   12/29/11 11:07 AM      Component Value Range   Color, Urine YELLOW  YELLOW    APPearance CLEAR  CLEAR    Specific Gravity, Urine 1.018  1.005 - 1.030    pH 5.5  5.0 - 8.0    Glucose, UA NEGATIVE  NEGATIVE (mg/dL)   Hgb urine dipstick SMALL (*) NEGATIVE    Bilirubin Urine NEGATIVE  NEGATIVE    Ketones, ur NEGATIVE  NEGATIVE (mg/dL)   Protein, ur NEGATIVE  NEGATIVE (mg/dL)   Urobilinogen, UA 0.2  0.0 - 1.0 (mg/dL)   Nitrite NEGATIVE  NEGATIVE    Leukocytes, UA NEGATIVE  NEGATIVE   URINE MICROSCOPIC-ADD ON     Status: Normal   Collection Time   12/29/11 11:07 AM      Component Value Range   WBC, UA 0-2  <3 (WBC/hpf)  CBC     Status: Normal   Collection Time   12/29/11 11:45 AM      Component Value Range   WBC 9.7  4.0 - 10.5 (K/uL)   RBC 5.13  4.22 - 5.81 (MIL/uL)   Hemoglobin 15.7  13.0 - 17.0 (g/dL)   HCT 21.3  08.6 - 57.8 (%)   MCV 85.4  78.0 - 100.0 (fL)   MCH 30.6  26.0 - 34.0 (pg)   MCHC 35.8  30.0 - 36.0 (g/dL)   RDW 46.9  62.9 - 52.8 (%)   Platelets 210  150 - 400 (K/uL)  DIFFERENTIAL     Status: Normal   Collection Time   12/29/11 11:45 AM      Component Value Range   Neutrophils Relative 73  43 - 77 (%)   Neutro Abs 7.1  1.7 - 7.7 (K/uL)   Lymphocytes Relative 19  12 - 46 (%)   Lymphs Abs 1.8  0.7 - 4.0 (K/uL)   Monocytes Relative 7  3 - 12 (%)   Monocytes Absolute 0.7  0.1 - 1.0 (K/uL)   Eosinophils Relative 1  0 - 5 (%)   Eosinophils Absolute 0.1  0.0 - 0.7  (K/uL)   Basophils Relative 0  0 - 1 (%)   Basophils Absolute 0.0  0.0 - 0.1 (K/uL)  BASIC  METABOLIC PANEL     Status: Abnormal   Collection Time   12/29/11 11:45 AM      Component Value Range   Sodium 136  135 - 145 (mEq/L)   Potassium 4.3  3.5 - 5.1 (mEq/L)   Chloride 101  96 - 112 (mEq/L)   CO2 27  19 - 32 (mEq/L)   Glucose, Bld 92  70 - 99 (mg/dL)   BUN 23  6 - 23 (mg/dL)   Creatinine, Ser 1.61 (*) 0.50 - 1.35 (mg/dL)   Calcium 9.4  8.4 - 09.6 (mg/dL)   GFR calc non Af Amer 42 (*) >90 (mL/min)   GFR calc Af Amer 48 (*) >90 (mL/min)   No results found for this or any previous visit (from the past 240 hour(s)).  Renal Function:  Basename 12/29/11 1145  CREATININE 1.84*   The CrCl is unknown because both a height and weight (above a minimum accepted value) are required for this calculation.  Radiologic Imaging: Ct Abdomen Pelvis Wo Contrast  12/29/2011  *RADIOLOGY REPORT*  Clinical Data: Right flank pain.  CT ABDOMEN AND PELVIS WITHOUT CONTRAST  Technique:  Multidetector CT imaging of the abdomen and pelvis was performed following the standard protocol without intravenous contrast.  Comparison: 01/22/2010  Findings: No focal abnormalities seen in the liver or spleen.  The stomach, duodenum, pancreas, gallbladder, and adrenal glands are unremarkable.  5 mm nonobstructing stone is seen in the interpolar left kidney.  There is mild right hydronephrosis with prominent right perinephric edema/fluid.  3 x 2 x 4 mm stone is identified in the distal right ureter about 1 - 2 cm proximal to the UVJ.  There is a fairly substantial amount of fluid in the right retroperitoneal tissues, tracking down towards the pelvis. Calyceal rupture would be a consideration.  No abdominal aortic aneurysm.  No abdominal lymphadenopathy.  Imaging through the pelvis shows no free intraperitoneal fluid. There is edema/fluid in the pelvic floor.  This has most likely tracked down from the right kidney/ureter.  Bladder  is unremarkable without evidence for bladder stones.  No colonic diverticulitis. The terminal ileum is normal.  Visualized portions of the appendix are normal.  Bone windows reveal no worrisome lytic or sclerotic osseous lesions.  IMPRESSION: 3 x 2 x 4 mm stone in the distal right ureter is associated with mild right hydronephrosis and moderate perinephric and periureteric edema/fluid.  Original Report Authenticated By: ERIC A. MANSELL, M.D.    I independently reviewed the above imaging studies.  Impression/Assessment:  Left distal ureteral calculus  Plan:  His pain is currently controlled.  He can be discharged and home and will be scheduled for outpatient followup next week.  He should strain his urine and continue pain medication prn and tamsulosin.  He knows to call should he develop fever > 101, uncontrolled pain, or persistent nausea/vomiting.  Georgette Helmer,LES 12/29/2011, 3:40 PM    Moody Bruins MD

## 2011-12-30 NOTE — ED Provider Notes (Signed)
Medical screening examination/treatment/procedure(s) were conducted as a shared visit with non-physician practitioner(s) and myself.  I personally evaluated the patient during the encounter.  51 year old male with flank pain. CT abdomen and pelvis was significant for a ureteral stone with mild hydronephrosis. Patient does have an increase in his creatinine. Last one for comparison was 4 years ago though. Patient denies history of renal insufficient. UA with no suggestion of infection. Case was discussed with urology who examined pt as well. They agree with plan for discharge and close outpatient followup at this time. Discussed elevated Cr with pt and understands need for fu. Return precautiosn dicussed.   Raeford Razor, MD 12/30/11 [phone]

## 2013-09-23 ENCOUNTER — Encounter: Payer: Self-pay | Admitting: Internal Medicine

## 2013-09-23 DIAGNOSIS — I10 Essential (primary) hypertension: Secondary | ICD-10-CM | POA: Insufficient documentation

## 2013-09-23 DIAGNOSIS — E559 Vitamin D deficiency, unspecified: Secondary | ICD-10-CM | POA: Insufficient documentation

## 2013-09-23 DIAGNOSIS — E349 Endocrine disorder, unspecified: Secondary | ICD-10-CM | POA: Insufficient documentation

## 2013-09-23 DIAGNOSIS — N200 Calculus of kidney: Secondary | ICD-10-CM | POA: Insufficient documentation

## 2013-09-23 DIAGNOSIS — E782 Mixed hyperlipidemia: Secondary | ICD-10-CM | POA: Insufficient documentation

## 2013-09-29 DIAGNOSIS — R7309 Other abnormal glucose: Secondary | ICD-10-CM | POA: Insufficient documentation

## 2013-09-29 NOTE — Progress Notes (Signed)
Patient ID: Sean Vasquez, male   DOB: 06-18-1961, 52 y.o.   MRN: [id number]   This very nice 52 yo SWM presents for 3 month follow up with Hypertension, Hyperlipidemia, Pre-Diabetes, Hypogonadism  and Vitamin D Deficiency.    BP has been controlled at home. Today's BP is    . Patient denies any cardiac type chest pain, palpitations, dyspnea/orthopnea/PND, dizziness, claudication, or dependent edema.   Hyperlipidemia is controlled with diet & supplements.  Patient is statin intolerant. Last Cholesterol was221, Triglycerides were 124, HDL 34 and LDL 162. Patient denies myalgias or other med SE's.    Also, the patient has history of PreDiabetes/insulin resistance with last A1c of 5.0% with an elevated insulin of 36 in June suggesting insulin resistance. Patient denies any symptoms of reactive hypoglycemia, diabetic polys, paresthesias or visual blurring.   Further, Patient has history of Vitamin D Deficiency with last vitamin D of 97 in September (was 23 in 2008). Patient supplements vitamin D without any suspected side-effects.  Current Outpatient Prescriptions on File Prior to Visit  Medication Sig Dispense Refill  . aspirin 81 MG chewable tablet Chew 81 mg by mouth daily.      Marland Kitchen atenolol (TENORMIN) 100 MG tablet Take 100 mg by mouth daily.        . ergocalciferol (DRISDOL) 8000 UNIT/ML drops Take by mouth daily.        . fish oil-omega-3 fatty acids 1000 MG capsule Take 1 g by mouth daily.        . Flaxseed, Linseed, (FLAXSEED OIL) 1000 MG CAPS Take by mouth daily.        Marland Kitchen HYDROcodone-acetaminophen (NORCO) 5-325 MG per tablet Take 3 tablets by mouth every 6 (six) hours as needed. For pain      . hydrocortisone (PROCTOZONE-HC) 2.5 % rectal cream Place rectally as needed.      . Magnesium 300 MG CAPS Take by mouth. Takes 3 tablest daily       . Multiple Vitamin (MULTIVITAMIN) tablet Take 1 tablet by mouth daily.        . Tamsulosin HCl (FLOMAX) 0.4 MG CAPS Take 0.4 mg by mouth.      .  Testosterone (ANDROGEL PUMP) 20.25 MG/ACT (1.62%) GEL Place onto the skin daily.            Allergies  Allergen Reactions  . Crestor [Rosuvastatin]     Elevated CPK  . Lipitor [Atorvastatin]     Myalgias  . Tricor [Fenofibrate]     Myalgias  . Vytorin [Ezetimibe-Simvastatin]     Myalgias  . Zetia [Ezetimibe]     PMHx:   Past Medical History  Diagnosis Date  . Testosterone deficiency   . Hemorrhoids   . Hyperlipidemia   . Hypertension   . Kidney stone   . Vitamin D deficiency   . Allergy     FHx:    Reviewed / unchanged  SHx:    Reviewed / unchanged  Systems Review: Constitutional: Denies fever, chills, wt changes, headaches, insomnia, fatigue, night sweats, change in appetite. Eyes: Denies redness, blurred vision, diplopia, discharge, itchy, watery eyes.  ENT: Denies discharge, congestion, post nasal drip, epistaxis, sore throat, earache, hearing loss, dental pain, tinnitus, vertigo, sinus pain, snoring.  CV: Denies chest pain, palpitations, irregular heartbeat, syncope, dyspnea, diaphoresis, orthopnea, PND, claudication, edema. Respiratory: denies cough, dyspnea, DOE, pleurisy, hoarseness, laryngitis, wheezing.  Gastrointestinal: Denies dysphagia, odynophagia, heartburn, reflux, water brash, abdominal pain or cramps, nausea, vomiting, bloating, diarrhea, constipation, hematemesis, melena, hematochezia,  or hemorrhoids. Genitourinary: Denies dysuria, frequency, urgency, nocturia, hesitancy, discharge, hematuria, flank pain. Musculoskeletal: Denies arthralgias, myalgias, stiffness, jt. swelling, pain, limp, strain/sprain.  Skin: Denies pruritus, rash, hives, warts, acne, eczema, change in skin lesion(s). Neuro: No weakness, tremor, incoordination, spasms, paresthesia, or pain. Psychiatric: Denies confusion, memory loss, or sensory loss. Endo: Denies change in weight, skin, hair change.  Heme/Lymph: No excessive bleeding, bruising, orenlarged lymph nodes.  Filed Vitals:    09/30/13 0855  BP: 136/84  Pulse: 60  Temp: 98.1 F (36.7 C)  Resp: 16    Estimated body mass index is 31.39 kg/(m^2) as calculated from the following:   Height as of 12/12/11: 5\' 6"  (1.676 m).   Weight as of this encounter: 194 lb 6.4 oz (88.179 kg).  On Exam: Appears well nourished - in no distress. Eyes: PERRLA, EOMs, conjunctiva no swelling or erythema. Sinuses: No frontal/maxillary tenderness ENT/Mouth: EAC's clear, TM's nl w/o erythema, bulging. Nares clear w/o erythema, swelling, exudates. Oropharynx clear without erythema or exudates. Oral hygiene is good. Tongue normal, non obstructing. Hearing intact.  Neck: Supple. Thyroid nl. Car 2+/2+ without bruits, nodes or JVD. Chest: Respirations nl with BS clear & equal w/o rales, rhonchi, wheezing or stridor.  Cor: Heart sounds normal w/ regular rate and rhythm without sig. murmurs, gallops, clicks, or rubs. Peripheral pulses normal and equal  without edema.  Abdomen: Soft & bowel sounds normal. Non-tender w/o guarding, rebound, hernias, masses, or organomegaly.  Lymphatics: Unremarkable.  Musculoskeletal: Full ROM all peripheral extremities, joint stability, 5/5 strength, and normal gait.  Skin: Warm, dry without exposed rashes, lesions, ecchymosis apparent.  Neuro: Cranial nerves intact, reflexes equal bilaterally. Sensory-motor testing grossly intact. Tendon reflexes grossly intact.  Pysch: Alert & oriented x 3. Insight and judgement nl & appropriate. No ideations.  Assessment and Plan:  1. Hypertension - Continue monitor blood pressure at home. Continue diet/meds same.  2. Hyperlipidemia - Continue diet/meds, exercise,& lifestyle modifications. Continue monitor periodic cholesterol/liver & renal functions   3. Pre-diabetes/Insulin Resistance - Continue diet, exercise, lifestyle modifications. Monitor appropriate labs.  4. Vitamin D Deficiency - Continue supplementation.  5. Testosterone Deficiency - monitor levels    Recommended regular exercise, BP monitoring, weight control, and discussed med and SE's. Recommended labs to assess and monitor clinical status. Further disposition pending results of labs.

## 2013-09-29 NOTE — Patient Instructions (Signed)

## 2013-09-30 ENCOUNTER — Encounter: Payer: Self-pay | Admitting: Internal Medicine

## 2013-09-30 ENCOUNTER — Ambulatory Visit (INDEPENDENT_AMBULATORY_CARE_PROVIDER_SITE_OTHER): Payer: BC Managed Care – PPO | Admitting: Internal Medicine

## 2013-09-30 VITALS — BP 136/84 | HR 60 | Temp 98.1°F | Resp 16 | Wt 194.4 lb

## 2013-09-30 DIAGNOSIS — Z79899 Other long term (current) drug therapy: Secondary | ICD-10-CM

## 2013-09-30 DIAGNOSIS — E349 Endocrine disorder, unspecified: Secondary | ICD-10-CM

## 2013-09-30 DIAGNOSIS — N529 Male erectile dysfunction, unspecified: Secondary | ICD-10-CM

## 2013-09-30 DIAGNOSIS — R7309 Other abnormal glucose: Secondary | ICD-10-CM

## 2013-09-30 DIAGNOSIS — E782 Mixed hyperlipidemia: Secondary | ICD-10-CM

## 2013-09-30 DIAGNOSIS — E559 Vitamin D deficiency, unspecified: Secondary | ICD-10-CM

## 2013-09-30 DIAGNOSIS — I10 Essential (primary) hypertension: Secondary | ICD-10-CM

## 2013-09-30 LAB — CBC WITH DIFFERENTIAL/PLATELET
Basophils Absolute: 0 10*3/uL (ref 0.0–0.1)
Eosinophils Absolute: 0.1 10*3/uL (ref 0.0–0.7)
Eosinophils Relative: 1 % (ref 0–5)
Lymphocytes Relative: 34 % (ref 12–46)
Lymphs Abs: 2 10*3/uL (ref 0.7–4.0)
MCH: 31.1 pg (ref 26.0–34.0)
MCV: 87.2 fL (ref 78.0–100.0)
Neutrophils Relative %: 57 % (ref 43–77)
Platelets: 234 10*3/uL (ref 150–400)
RBC: 4.99 MIL/uL (ref 4.22–5.81)
RDW: 14.4 % (ref 11.5–15.5)
WBC: 5.8 10*3/uL (ref 4.0–10.5)

## 2013-09-30 MED ORDER — LISINOPRIL 20 MG PO TABS: 20.0000 mg | ORAL_TABLET | Freq: Every day | ORAL | Status: DC

## 2013-10-01 LAB — HEPATIC FUNCTION PANEL
Albumin: 4.2 g/dL (ref 3.5–5.2)
Total Bilirubin: 0.6 mg/dL (ref 0.3–1.2)
Total Protein: 6.4 g/dL (ref 6.0–8.3)

## 2013-10-01 LAB — INSULIN, FASTING: Insulin fasting, serum: 20 u[IU]/mL (ref 3–28)

## 2013-10-01 LAB — TESTOSTERONE: Testosterone: 300 ng/dL (ref 300–890)

## 2013-10-01 LAB — LIPID PANEL
HDL: 33 mg/dL — ABNORMAL LOW (ref >39)
LDL Cholesterol: 130 mg/dL — ABNORMAL HIGH (ref 0–99)
Total CHOL/HDL Ratio: 6.5 Ratio
VLDL: 53 mg/dL — ABNORMAL HIGH (ref 0–40)

## 2013-10-01 LAB — TSH: TSH: 2.013 u[IU]/mL (ref 0.350–4.500)

## 2013-10-02 ENCOUNTER — Telehealth: Payer: Self-pay | Admitting: *Deleted

## 2013-10-02 NOTE — Telephone Encounter (Signed)
Message copied by Reggy Eye on Thu Oct 02, 2013  8:22 AM ------      Message from: Lucky Cowboy      Created: Wed Oct 01, 2013 11:53 PM       elev chol 216 ( ideal <180)      elev trig 263 ( ideal < 159)      LDL bad Chol 130 very high      Need stricter low chol diet - recc no animal products ie no meat esp red meat- fish is ok - no dairy - esp no cheese ------

## 2013-11-21 ENCOUNTER — Encounter: Payer: Self-pay | Admitting: Internal Medicine

## 2013-11-21 ENCOUNTER — Ambulatory Visit (INDEPENDENT_AMBULATORY_CARE_PROVIDER_SITE_OTHER): Payer: BC Managed Care – PPO | Admitting: Internal Medicine

## 2013-11-21 VITALS — BP 142/90 | HR 60 | Temp 98.6°F | Resp 16 | Wt 190.2 lb

## 2013-11-21 DIAGNOSIS — L723 Sebaceous cyst: Secondary | ICD-10-CM

## 2013-11-21 DIAGNOSIS — I10 Essential (primary) hypertension: Secondary | ICD-10-CM

## 2013-11-21 NOTE — Progress Notes (Signed)
Subjective:    Patient ID: Sean Vasquez, male    DOB: Dec 11, 1960, 53 y.o.   MRN: [id number]  Hypertension This is a chronic problem. The current episode started more than 1 year ago. The problem is controlled. Pertinent negatives include no chest pain, headaches, malaise/fatigue, neck pain, palpitations, peripheral edema or shortness of breath. Risk factors for coronary artery disease include dyslipidemia, family history, male gender and stress. Past treatments include beta blockers and ACE inhibitors. The current treatment provides significant improvement. There are no compliance problems.  There is no history of kidney disease or CAD/MI.   pt presents for f/u of HTN and also c/o lump at nape of neck    Medication List       This list is accurate as of: 11/21/13  5:28 PM.  Always use your most recent med list.               ANDROGEL PUMP 20.25 MG/ACT (1.62%) Gel  Generic drug:  Testosterone  Place onto the skin daily.     aspirin 81 MG chewable tablet  Chew 81 mg by mouth daily.     atenolol 100 MG tablet  Commonly known as:  TENORMIN  Take 100 mg by mouth daily.     ergocalciferol 8000 UNIT/ML drops  Commonly known as:  DRISDOL  Take by mouth daily.     fish oil-omega-3 fatty acids 1000 MG capsule  Take 1 g by mouth daily.     Flaxseed Oil 1000 MG Caps  Take by mouth daily.     HYDROcodone-acetaminophen 5-325 MG per tablet  Commonly known as:  NORCO/VICODIN  Take 3 tablets by mouth every 6 (six) hours as needed. For pain     lisinopril 20 MG tablet  Commonly known as:  PRINIVIL,ZESTRIL  Take 1 tablet (20 mg total) by mouth daily. At bedtime for BP     Magnesium 300 MG Caps  Take by mouth. Takes 3 tablest daily     multivitamin tablet  Take 1 tablet by mouth daily.     PROCTOZONE-HC 2.5 % rectal cream  Generic drug:  hydrocortisone  Place rectally as needed.       Allergies  Allergen Reactions  . Crestor [Rosuvastatin]     Elevated CPK  . Lipitor  [Atorvastatin]     Myalgias  . Tricor [Fenofibrate]     Myalgias  . Vytorin [Ezetimibe-Simvastatin]     Myalgias  . Zetia [Ezetimibe]      Review of Systems  Constitutional: Negative.  Negative for malaise/fatigue.  HENT: Negative.   Eyes: Negative.   Respiratory: Negative.  Negative for cough, chest tightness, shortness of breath and wheezing.   Cardiovascular: Negative.  Negative for chest pain and palpitations.  Gastrointestinal: Negative.   Endocrine: Negative.   Genitourinary: Negative.   Musculoskeletal: Negative for neck pain.  Skin:       C/o lump at nape of neck  Neurological: Negative.  Negative for headaches.    Objective:   Physical Exam  Constitutional: He is oriented to person, place, and time. He appears well-developed and well-nourished.  HENT:  Head: Normocephalic and atraumatic.  Right Ear: External ear normal.  Left Ear: External ear normal.  Nose: Nose normal.  Mouth/Throat: Oropharynx is clear and moist. No oropharyngeal exudate.  Eyes: EOM are normal. Pupils are equal, round, and reactive to light.  Neck: Normal range of motion. Neck supple.  6 x 8 mm sl tender sub cutaneous movable lump at nape  of neck  Cardiovascular: Normal rate, regular rhythm, normal heart sounds and intact distal pulses.   No murmur heard. Pulmonary/Chest: Effort normal. No respiratory distress. He has no wheezes. He has no rales.  Abdominal: Soft. Bowel sounds are normal.  Neurological: He is alert and oriented to person, place, and time. No cranial nerve deficit.  Skin: Skin is warm and dry.    Assessment & Plan:   1. Unspecified essential hypertension  2. Sebaceous cyst  plan excision -------------------------------------------------------------------------------------------------------------------------------  Patient presents for evaluation of sebaceous cyst at nape of neck The risks, benefits, indications, potential complications, and alternatives were explained  to the patient.  Procedure Details  After informed consent and local anesthesia with xylocaine   marcaine with epi 1%, the area was prepped by sterile/aseptic technique with isopropyl alcohol.  Then a # 11 blade was used to excise an elliptical area of skin approximately  12 mmm by 4 mm. The lesion was sharply dissected and delivered for pathologic examination. The wound was closed with  # 2 vertical mattress 3-0 nylon sutures.  Antibiotic ointment and a sterile dressing applied.  2" x 3" tegoderm was applied. The patient tolerated the procedure well with minimal blood loss.   Condition: Stable  Complications:  None  Diagnosis:  SEBACEOUS CYST,  NAPE OF NECK  Procedure code:   CPT 11421  Plan: 1. Patient was instructed to keep the wound dry and covered for 24-48 hours and clean thereafter. 2. Warning signs of infection were reviewed & pt advised to call if any questions/problems.    3. Recommended that the patient use Tylenol/ Ibuprofen as needed for pain.   4. Return for suture removal in 10-12 days.

## 2013-11-21 NOTE — Patient Instructions (Signed)

## 2013-12-01 ENCOUNTER — Encounter: Payer: Self-pay | Admitting: Internal Medicine

## 2013-12-01 ENCOUNTER — Ambulatory Visit: Payer: Self-pay | Admitting: Internal Medicine

## 2013-12-01 VITALS — BP 158/90 | HR 64 | Temp 97.9°F | Resp 18 | Wt 190.2 lb

## 2013-12-01 DIAGNOSIS — L723 Sebaceous cyst: Secondary | ICD-10-CM

## 2013-12-01 NOTE — Progress Notes (Signed)
   Subjective:    Patient ID: Sean Vasquez, male    DOB: 1961/06/15, 53 y.o.   MRN: [id number]  HPI Patient returns after recent excision of Sebaceous Cyst at nape of neck. BP is noted elevated at 158/90 and confirmed on repeat. Apparently he self discontinued his Lisinopril 20 mg.    Review of Systems  Neg.      Objective:   Physical Exam   Surgical site appears well healed w/o signs of infection.        Assessment & Plan:  1. Sebaceous cyst - sutures x 2 removed 2. HTN - continue Atenolol and to restart Lisinopril

## 2013-12-10 ENCOUNTER — Other Ambulatory Visit: Payer: Self-pay | Admitting: Internal Medicine

## 2014-01-17 ENCOUNTER — Other Ambulatory Visit: Payer: Self-pay | Admitting: Emergency Medicine

## 2014-01-22 IMAGING — CT CT ABD-PELV W/O CM
1 series · 15 of 27 positions shown, 19 images · non-contrast
Comparison: 01/22/2010

CLINICAL DATA: Right flank pain.

CT ABDOMEN AND PELVIS WITHOUT CONTRAST
TECHNIQUE: Multidetector CT imaging of the abdomen and pelvis was
performed following the standard protocol without intravenous
contrast.

[Series 4: lung · axial · 0.70mm/px · z∈[-111,+9]mm · 15 of 27 slices shown, 19 images]
[im 2/27  soft-tissue]
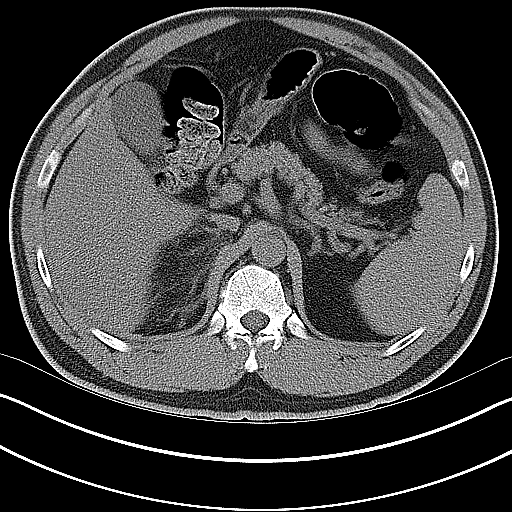
[im 2/27  bone]
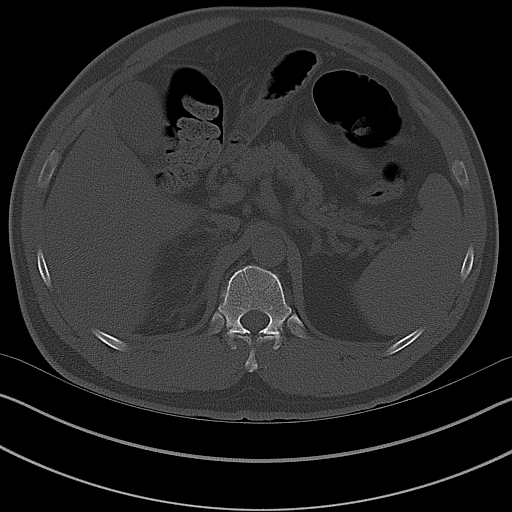
[im 4/27  soft-tissue]
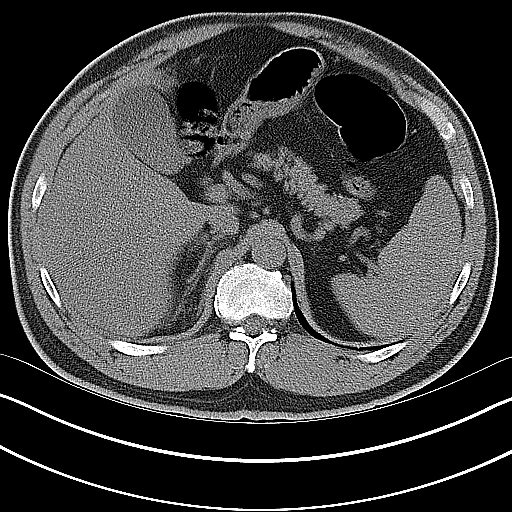
[im 6/27  soft-tissue]
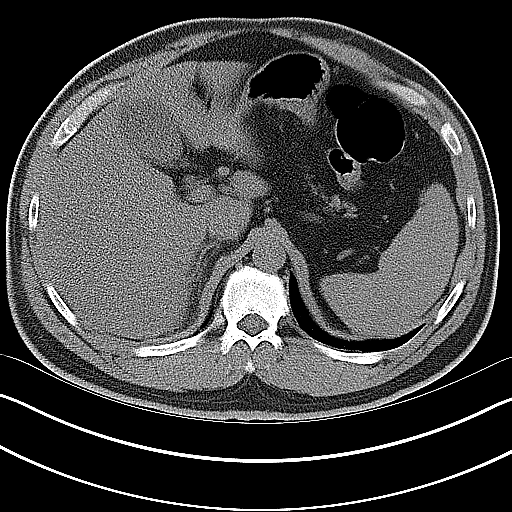
[im 8/27  soft-tissue]
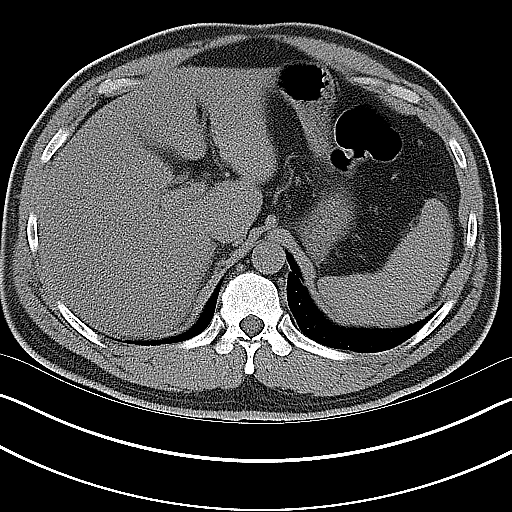
[im 10/27  soft-tissue]
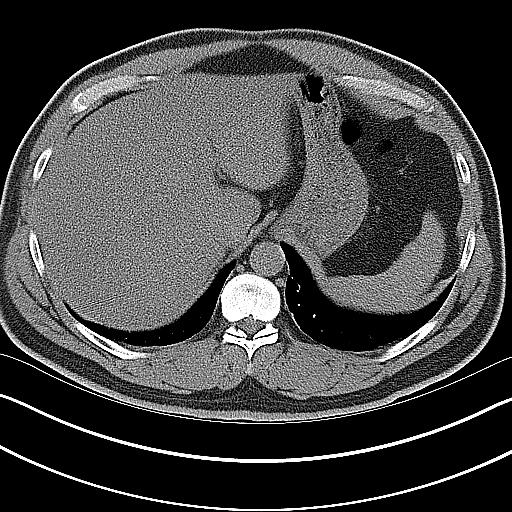
[im 12/27  soft-tissue]
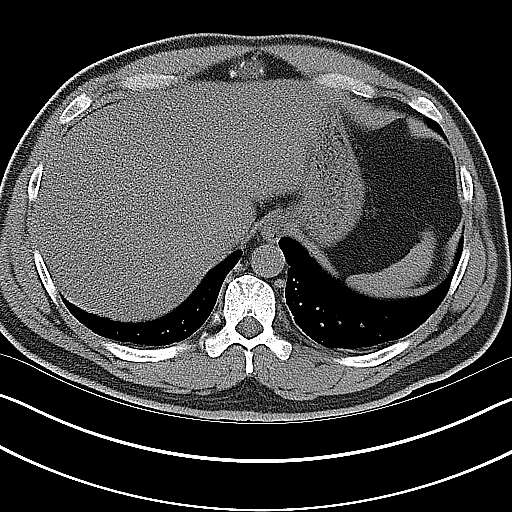
[im 14/27  soft-tissue]
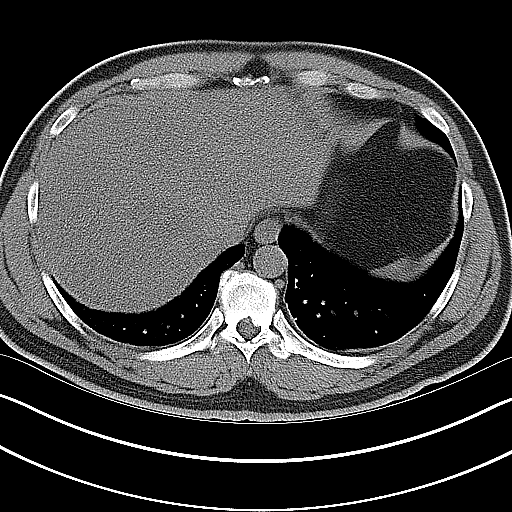
[im 16/27  soft-tissue]
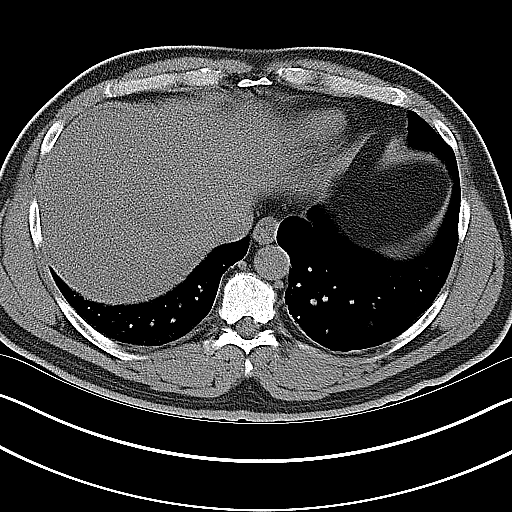
[im 18/27  soft-tissue]
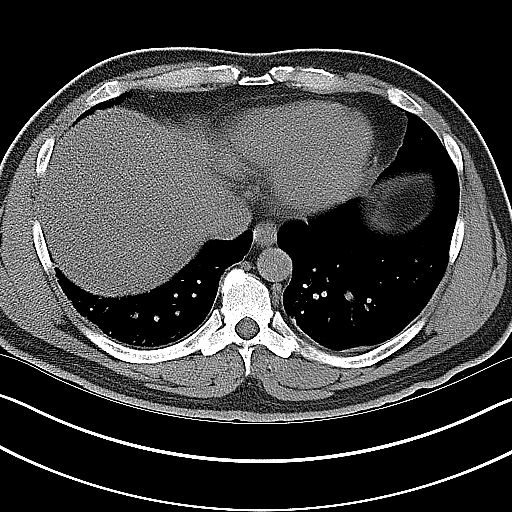
[im 18/27  bone]
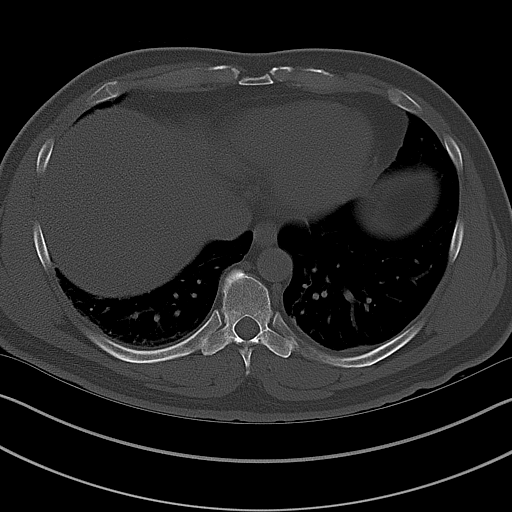
[im 20/27  soft-tissue]
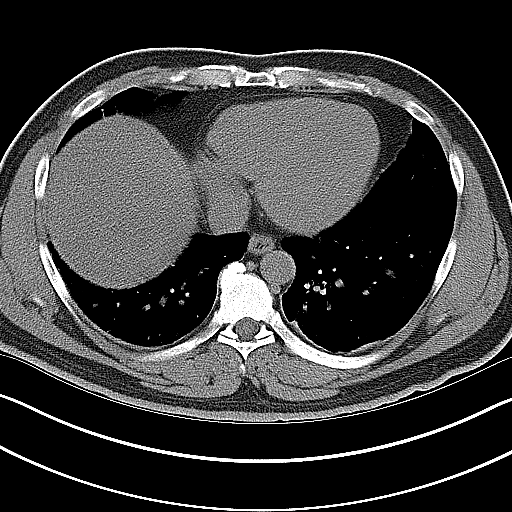
[im 22/27  soft-tissue]
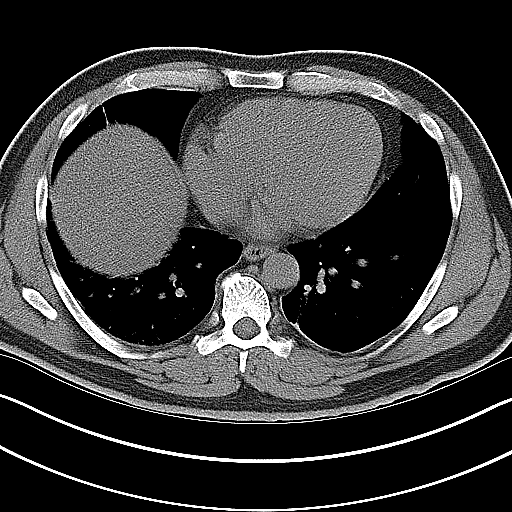
[im 23/27  lung]
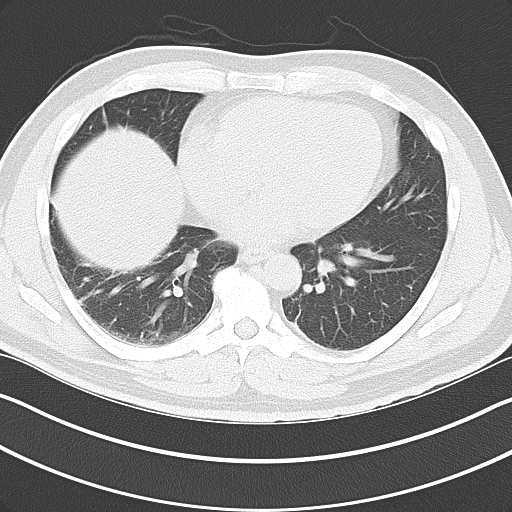
[im 24/27  soft-tissue]
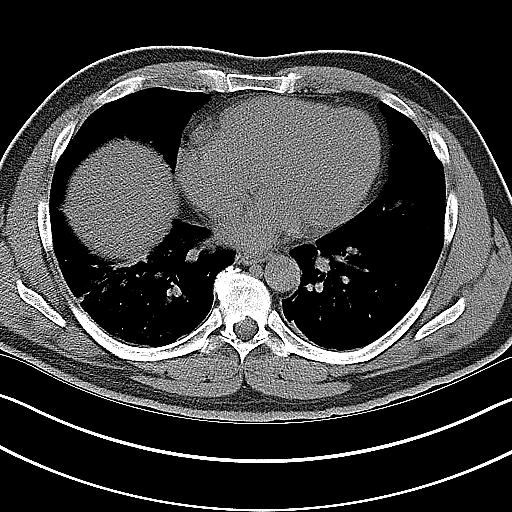
[im 24/27  lung]
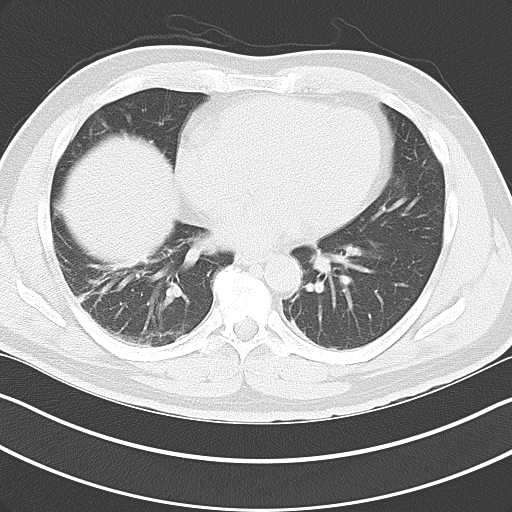
[im 25/27  lung]
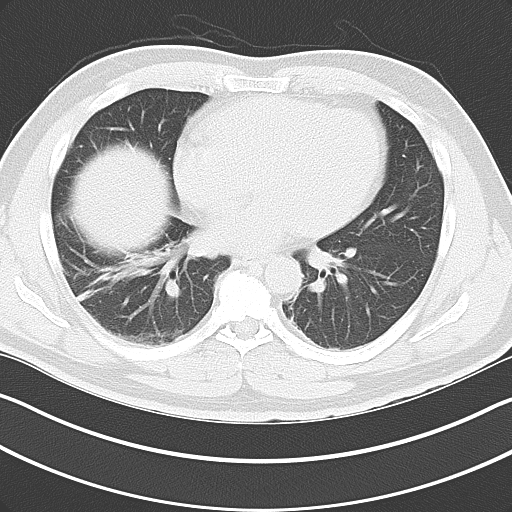
[im 26/27  soft-tissue]
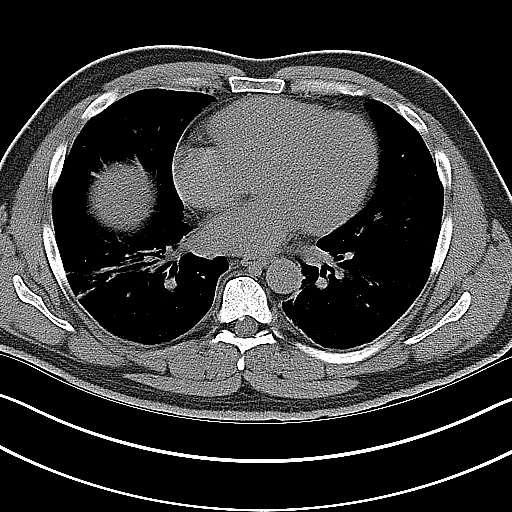
[im 26/27  lung]
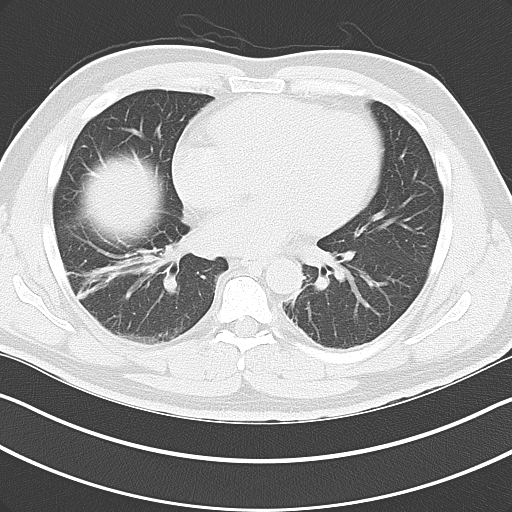

[15 of 27 positions shown; findings below may reference images not displayed]

FINDINGS: No focal abnormalities seen in the liver or spleen.  The stomach,
duodenum, pancreas, gallbladder, and adrenal glands are
unremarkable.  5 mm nonobstructing stone is seen in the interpolar
left kidney.  There is mild right hydronephrosis with prominent
right perinephric edema/fluid.  3 x 2 x 4 mm stone is identified in
the distal right ureter about 1 - 2 cm proximal to the UVJ.  There
is a fairly substantial amount of fluid in the right
retroperitoneal tissues, tracking down towards the pelvis.
Calyceal rupture would be a consideration.

No abdominal aortic aneurysm.  No abdominal lymphadenopathy.

Imaging through the pelvis shows no free intraperitoneal fluid.
There is edema/fluid in the pelvic floor.  This has most likely
tracked down from the right kidney/ureter.  Bladder is unremarkable
without evidence for bladder stones.  No colonic diverticulitis.
The terminal ileum is normal.  Visualized portions of the appendix
are normal.

Bone windows reveal no worrisome lytic or sclerotic osseous
lesions.
IMPRESSION: 3 x 2 x 4 mm stone in the distal right ureter is associated with
mild right hydronephrosis and moderate perinephric and periureteric
edema/fluid.

## 2014-03-25 ENCOUNTER — Encounter: Payer: Self-pay | Admitting: Internal Medicine

## 2014-03-25 DIAGNOSIS — Z136 Encounter for screening for cardiovascular disorders: Secondary | ICD-10-CM | POA: Insufficient documentation

## 2014-03-25 DIAGNOSIS — Z79899 Other long term (current) drug therapy: Secondary | ICD-10-CM | POA: Insufficient documentation

## 2014-03-25 NOTE — Progress Notes (Signed)
Patient ID: Sean Vasquez, male   DOB: Oct 11, 1961, 53 y.o.   MRN: [id number]  R E S C H E D U L E  Due to  I N S U R A N C E

## 2014-03-25 NOTE — Patient Instructions (Addendum)
 Hypertension As your heart beats, it forces blood through your arteries. This force is your blood pressure. If the pressure is too high, it is called hypertension (HTN) or high blood pressure. HTN is dangerous because you may have it and not know it. High blood pressure may mean that your heart has to work harder to pump blood. Your arteries may be narrow or stiff. The extra work puts you at risk for heart disease, stroke, and other problems.  Blood pressure consists of two numbers, a higher number over a lower, 110/72, for example. It is stated as "110 over 72." The ideal is below 120 for the top number (systolic) and under 80 for the bottom (diastolic). Write down your blood pressure today. You should pay close attention to your blood pressure if you have certain conditions such as:  Heart failure.  Prior heart attack.  Diabetes  Chronic kidney disease.  Prior stroke.  Multiple risk factors for heart disease. To see if you have HTN, your blood pressure should be measured while you are seated with your arm held at the level of the heart. It should be measured at least twice. A one-time elevated blood pressure reading (especially in the Emergency Department) does not mean that you need treatment. There may be conditions in which the blood pressure is different between your right and left arms. It is important to see your caregiver soon for a recheck. Most people have essential hypertension which means that there is not a specific cause. This type of high blood pressure may be lowered by changing lifestyle factors such as:  Stress.  Smoking.  Lack of exercise.  Excessive weight.  Drug/tobacco/alcohol use.  Eating less salt. Most people do not have symptoms from high blood pressure until it has caused damage to the body. Effective treatment can often prevent, delay or reduce that damage. TREATMENT  When a cause has been identified, treatment for high blood pressure is directed at  the cause. There are a large number of medications to treat HTN. These fall into several categories, and your caregiver will help you select the medicines that are best for you. Medications may have side effects. You should review side effects with your caregiver. If your blood pressure stays high after you have made lifestyle changes or started on medicines,   Your medication(s) may need to be changed.  Other problems may need to be addressed.  Be certain you understand your prescriptions, and know how and when to take your medicine.  Be sure to follow up with your caregiver within the time frame advised (usually within two weeks) to have your blood pressure rechecked and to review your medications.  If you are taking more than one medicine to lower your blood pressure, make sure you know how and at what times they should be taken. Taking two medicines at the same time can result in blood pressure that is too low. SEEK IMMEDIATE MEDICAL CARE IF:  You develop a severe headache, blurred or changing vision, or confusion.  You have unusual weakness or numbness, or a faint feeling.  You have severe chest or abdominal pain, vomiting, or breathing problems. MAKE SURE YOU:   Understand these instructions.  Will watch your condition.  Will get help right away if you are not doing well or get worse.   Diabetes and Exercise Exercising regularly is important. It is not just about losing weight. It has many health benefits, such as:  Improving your overall fitness, flexibility, and   endurance.  Increasing your bone density.  Helping with weight control.  Decreasing your body fat.  Increasing your muscle strength.  Reducing stress and tension.  Improving your overall health. People with diabetes who exercise gain additional benefits because exercise:  Reduces appetite.  Improves the body's use of blood sugar (glucose).  Helps lower or control blood glucose.  Decreases blood  pressure.  Helps control blood lipids (such as cholesterol and triglycerides).  Improves the body's use of the hormone insulin by:  Increasing the body's insulin sensitivity.  Reducing the body's insulin needs.  Decreases the risk for heart disease because exercising:  Lowers cholesterol and triglycerides levels.  Increases the levels of good cholesterol (such as high-density lipoproteins [HDL]) in the body.  Lowers blood glucose levels. YOUR ACTIVITY PLAN  Choose an activity that you enjoy and set realistic goals. Your health care provider or diabetes educator can help you make an activity plan that works for you. You can break activities into 2 or 3 sessions throughout the day. Doing so is as good as one long session. Exercise ideas include:  Taking the dog for a walk.  Taking the stairs instead of the elevator.  Dancing to your favorite song.  Doing your favorite exercise with a friend. RECOMMENDATIONS FOR EXERCISING WITH TYPE 1 OR TYPE 2 DIABETES   Check your blood glucose before exercising. If blood glucose levels are greater than 240 mg/dL, check for urine ketones. Do not exercise if ketones are present.  Avoid injecting insulin into areas of the body that are going to be exercised. For example, avoid injecting insulin into:  The arms when playing tennis.  The legs when jogging.  Keep a record of:  Food intake before and after you exercise.  Expected peak times of insulin action.  Blood glucose levels before and after you exercise.  The type and amount of exercise you have done.  Review your records with your health care provider. Your health care provider will help you to develop guidelines for adjusting food intake and insulin amounts before and after exercising.  If you take insulin or oral hypoglycemic agents, watch for signs and symptoms of hypoglycemia. They include:  Dizziness.  Shaking.  Sweating.  Chills.  Confusion.  Drink plenty of water  while you exercise to prevent dehydration or heat stroke. Body water is lost during exercise and must be replaced.  Talk to your health care provider before starting an exercise program to make sure it is safe for you. Remember, almost any type of activity is better than none.

## 2014-04-07 ENCOUNTER — Encounter: Payer: Self-pay | Admitting: Internal Medicine

## 2014-05-04 ENCOUNTER — Other Ambulatory Visit: Payer: Self-pay | Admitting: Internal Medicine

## 2014-06-05 ENCOUNTER — Other Ambulatory Visit: Payer: Self-pay | Admitting: Physician Assistant

## 2014-06-16 ENCOUNTER — Encounter: Payer: Self-pay | Admitting: Physician Assistant

## 2014-06-16 ENCOUNTER — Ambulatory Visit (INDEPENDENT_AMBULATORY_CARE_PROVIDER_SITE_OTHER): Payer: Self-pay | Admitting: Physician Assistant

## 2014-06-16 VITALS — BP 120/80 | HR 60 | Temp 97.7°F | Resp 16 | Ht 66.0 in | Wt 190.0 lb

## 2014-06-16 DIAGNOSIS — E785 Hyperlipidemia, unspecified: Secondary | ICD-10-CM

## 2014-06-16 DIAGNOSIS — I10 Essential (primary) hypertension: Secondary | ICD-10-CM

## 2014-06-16 DIAGNOSIS — Z79899 Other long term (current) drug therapy: Secondary | ICD-10-CM

## 2014-06-16 DIAGNOSIS — E669 Obesity, unspecified: Secondary | ICD-10-CM

## 2014-06-16 DIAGNOSIS — E559 Vitamin D deficiency, unspecified: Secondary | ICD-10-CM

## 2014-06-16 DIAGNOSIS — E349 Endocrine disorder, unspecified: Secondary | ICD-10-CM

## 2014-06-16 DIAGNOSIS — R7309 Other abnormal glucose: Secondary | ICD-10-CM

## 2014-06-16 DIAGNOSIS — E291 Testicular hypofunction: Secondary | ICD-10-CM

## 2014-06-16 MED ORDER — TESTOSTERONE 20.25 MG/ACT (1.62%) TD GEL: TRANSDERMAL | Status: DC

## 2014-06-16 MED ORDER — ATENOLOL 100 MG PO TABS: ORAL_TABLET | ORAL | Status: DC

## 2014-06-16 NOTE — Progress Notes (Signed)
Assessment and Plan:  Hypertension: Continue medication, monitor blood pressure at home. Continue DASH diet. Cholesterol: Continue diet and exercise.  Pre-diabetes-Continue diet and exercise.  Vitamin D Def- check level and continue medications.  Hypogonadism- continue replacement therapy, check testosterone levels as needed.   Patient declines labs at this time, states he does not have the money. Will see back 6 months when he will have insurance, will call if needs anything.  Continue diet and meds as discussed. Further disposition pending results of labs.  HPI 53 y.o. male  presents for 3 month follow up with hypertension, hyperlipidemia, prediabetes and vitamin D. He does not have insurance at this time, he states it was too expensive. He will have to wait until the end of the year to get on insurance then.  His blood pressure has been controlled at home, today their BP is BP: 120/80 mmHg He does not workout, but has a physical job. He denies chest pain, shortness of breath, dizziness.  He is not on cholesterol medication and denies myalgias. His cholesterol is at goal. The cholesterol last visit was:   Lab Results  Component Value Date   CHOL 216* 09/30/2013   HDL 33* 09/30/2013   LDLCALC 130* 09/30/2013   TRIG 263* 09/30/2013   CHOLHDL 6.5 09/30/2013    Last A1C in the office was:  Lab Results  Component Value Date   HGBA1C 5.5 09/30/2013   Patient is on Vitamin D supplement.   Lab Results  Component Value Date   VD25OH 75 09/30/2013     He is has a history of testosterone deficiency and is on testosterone replacement, does androgel pump but has been out for 2 weeks. He states that the testosterone helps with his energy, libido, muscle mass. Lab Results  Component Value Date   TESTOSTERONE 300 09/30/2013   BMI is Body mass index is 30.68 kg/(m^2)., He has not gained any weight, he is trying to eat better.  Wt Readings from Last 3 Encounters:  06/16/14 190 lb (86.183 kg)   12/01/13 190 lb 3.2 oz (86.274 kg)  11/21/13 190 lb 3.2 oz (86.274 kg)     Current Medications:  Current Outpatient Prescriptions on File Prior to Visit  Medication Sig Dispense Refill  . ANDROGEL PUMP 20.25 MG/ACT (1.62%) GEL APPLY TWO PUMPS ONCE DAILY AS DIRECTED  75 g  0  . aspirin 81 MG chewable tablet Chew 81 mg by mouth daily.      Marland Kitchen atenolol (TENORMIN) 100 MG tablet TAKE ONE TABLET BY MOUTH ONCE DAILY  30 tablet  0  . Cholecalciferol (VITAMIN D PO) Take 2,000 Units by mouth. Takes 8000 units daily      . fish oil-omega-3 fatty acids 1000 MG capsule Take 1 g by mouth daily.        . Flaxseed, Linseed, (FLAXSEED OIL) 1000 MG CAPS Take by mouth daily.        Marland Kitchen HYDROcodone-acetaminophen (NORCO) 5-325 MG per tablet Take 3 tablets by mouth every 6 (six) hours as needed. For pain      . hydrocortisone (PROCTOZONE-HC) 2.5 % rectal cream Place rectally as needed.      . Magnesium 300 MG CAPS Take by mouth. Takes 3 tablest daily       . Multiple Vitamin (MULTIVITAMIN) tablet Take 1 tablet by mouth daily.         No current facility-administered medications on file prior to visit.   Medical History:  Past Medical History  Diagnosis Date  .  Testosterone deficiency   . Hemorrhoids   . Hyperlipidemia   . Hypertension   . Kidney stone   . Vitamin D deficiency   . Allergy    Allergies:  Allergies  Allergen Reactions  . Crestor [Rosuvastatin]     Elevated CPK  . Lipitor [Atorvastatin]     Myalgias  . Tricor [Fenofibrate]     Myalgias  . Vytorin [Ezetimibe-Simvastatin]     Myalgias  . Zetia [Ezetimibe]      Review of Systems:  = complains of   = denies  General: Fatigue  Fever  Chills  Weakness   Insomnia  Eyes: Redness  Blurred vision  Diplopia   ENT: Congestion  Sinus Pain  Post Nasal Drip  Sore Throat  Earache   Cardiac: Chest pain/pressure  SOB  Orthopnea   Palpitations   Paroxysmal nocturnal dyspnea[ ]   Claudication  Edema   Pulmonary: Cough  Wheezing[ ]   SOB   Snoring   GI: Nausea  Vomiting[ ]  Dysphagia[ ]  Heartburn[ ]  Abdominal pain  Constipation ; Diarrhea ; BRBPR  Melena[ ]  GU: Hematuria[ ]  Dysuria  Nocturia[ ]  Urgency   Hesitancy  Discharge  Neuro: Headaches[ ]  Vertigo[ ]  Paresthesias[ ]  Spasm  Speech changes  Incoordination   Ortho: Arthritis  Joint pain  Muscle pain  Joint swelling  Back Pain  Skin:  Rash   Pruritis  Change in skin lesion   Psych: Depression[ ]  Anxiety[ ]  Confusion  Memory loss   Heme/Lypmh: Bleeding  Bruising  Enlarged lymph nodes   Endocrine: Visual blurring  Paresthesia  Polyuria  Polydypsea    Heat/cold intolerance  Hypoglycemia   Family history- Review and unchanged Social history- Review and unchanged Physical Exam: BP 120/80  Pulse 60  Temp(Src) 97.7 F (36.5 C)  Resp 16  Ht  (1.676 m)  Wt 190 lb (86.183 kg)  BMI 30.68 kg/m2 Wt Readings from Last 3 Encounters:  06/16/14 190 lb (86.183 kg)  12/01/13 190 lb 3.2 oz (86.274 kg)  11/21/13 190 lb 3.2 oz (86.274 kg)   General Appearance: Well nourished, in no apparent distress. Eyes: PERRLA, EOMs, conjunctiva no swelling or erythema Sinuses: No Frontal/maxillary tenderness ENT/Mouth: Ext aud canals clear, TMs without erythema, bulging. No erythema, swelling, or exudate on post pharynx.  Tonsils not swollen or erythematous. Hearing normal.  Neck: Supple, thyroid normal.  Respiratory: Respiratory effort normal, BS equal bilaterally without rales, rhonchi, wheezing or stridor.  Cardio: RRR with no MRGs. Brisk peripheral pulses without edema.  Abdomen: Soft, + BS.  Non tender, no guarding, rebound, hernias, masses. Lymphatics: Non tender without lymphadenopathy.  Musculoskeletal: Full ROM, 5/5 strength, normal gait.  Skin: Warm, dry without rashes, lesions, ecchymosis.  Neuro: Cranial nerves  intact. Normal muscle tone, no cerebellar symptoms. Sensation intact.  Psych: Awake and oriented X 3, normal affect, Insight and Judgment appropriate.    Sean Vasquez 8:52 AM

## 2014-06-16 NOTE — Patient Instructions (Signed)
Your LDL is the bad cholesterol that can lead to heart attack and stroke. To lower your number you can decrease your fatty foods, red meat, cheese, milk and increase fiber like whole grains and veggies. You can also add a fiber supplement like Metamucil or Benefiber.   Cholesterol Cholesterol is a white, waxy, fat-like substance needed by your body in small amounts. The liver makes all the cholesterol you need. Cholesterol is carried from the liver by the blood through the blood vessels. Deposits of cholesterol (plaque) may build up on blood vessel walls. These make the arteries narrower and stiffer. Cholesterol plaques increase the risk for heart attack and stroke.  You cannot feel your cholesterol level even if it is very high. The only way to know it is high is with a blood test. Once you know your cholesterol levels, you should keep a record of the test results. Work with your health care provider to keep your levels in the desired range.  WHAT DO THE RESULTS MEAN?  Total cholesterol is a rough measure of all the cholesterol in your blood.   LDL is the so-called bad cholesterol. This is the type that deposits cholesterol in the walls of the arteries. You want this level to be low.   HDL is the good cholesterol because it cleans the arteries and carries the LDL away. You want this level to be high.  Triglycerides are fat that the body can either burn for energy or store. High levels are closely linked to heart disease.  WHAT ARE THE DESIRED LEVELS OF CHOLESTEROL?  Total cholesterol below 200.   LDL below 100 for people at risk, below 70 for those at very high risk.   HDL above 50 is good, above 60 is best.   Triglycerides below 150.  HOW CAN I LOWER MY CHOLESTEROL?  Diet. Follow your diet programs as directed by your health care provider.   Choose fish or white meat chicken and [place], roasted or baked. Limit fatty cuts of red meat, fried foods, and processed meats, such as  sausage and lunch meats.   Eat lots of fresh fruits and vegetables.  Choose whole grains, beans, pasta, potatoes, and cereals.   Use only small amounts of olive, corn, or canola oils.   Avoid butter, mayonnaise, shortening, or palm kernel oils.  Avoid foods with trans fats.   Drink skim or nonfat milk and eat low-fat or nonfat yogurt and cheeses. Avoid whole milk, cream, ice cream, egg yolks, and full-fat cheeses.   Healthy desserts include angel food cake, ginger snaps, animal crackers, hard candy, popsicles, and low-fat or nonfat frozen yogurt. Avoid pastries, cakes, pies, and cookies.   Exercise. Follow your exercise programs as directed by your health care provider.   A regular program helps decrease LDL and raise HDL.   A regular program helps with weight control.   Do things that increase your activity level like gardening, walking, or taking the stairs. Ask your health care provider about how you can be more active in your daily life.   Medicine. Take medicine only as directed by your health care provider.   Medicine may be prescribed by your health care provider to help lower cholesterol and decrease the risk for heart disease.   If you have several risk factors, you may need medicine even if your levels are normal. Document Released: 06/27/2001 Document Revised: 02/16/2014 Document Reviewed: 07/16/2013 [facility] Patient Information 2015 [place], [place]. This information is not intended to replace advice  given to you by your health care provider. Make sure you discuss any questions you have with your health care provider.

## 2014-09-28 ENCOUNTER — Other Ambulatory Visit: Payer: Self-pay | Admitting: Physician Assistant

## 2014-12-07 ENCOUNTER — Other Ambulatory Visit: Payer: Self-pay | Admitting: *Deleted

## 2014-12-07 MED ORDER — TESTOSTERONE 20.25 MG/ACT (1.62%) TD GEL: TRANSDERMAL | Status: DC

## 2014-12-15 ENCOUNTER — Ambulatory Visit (INDEPENDENT_AMBULATORY_CARE_PROVIDER_SITE_OTHER): Payer: BLUE CROSS/BLUE SHIELD | Admitting: Physician Assistant

## 2014-12-15 ENCOUNTER — Encounter: Payer: Self-pay | Admitting: Physician Assistant

## 2014-12-15 VITALS — BP 128/82 | HR 56 | Temp 97.7°F | Resp 16 | Ht 66.0 in | Wt 183.0 lb

## 2014-12-15 DIAGNOSIS — E291 Testicular hypofunction: Secondary | ICD-10-CM

## 2014-12-15 DIAGNOSIS — E669 Obesity, unspecified: Secondary | ICD-10-CM

## 2014-12-15 DIAGNOSIS — E349 Endocrine disorder, unspecified: Secondary | ICD-10-CM

## 2014-12-15 DIAGNOSIS — Z79899 Other long term (current) drug therapy: Secondary | ICD-10-CM

## 2014-12-15 DIAGNOSIS — E559 Vitamin D deficiency, unspecified: Secondary | ICD-10-CM

## 2014-12-15 DIAGNOSIS — R7309 Other abnormal glucose: Secondary | ICD-10-CM

## 2014-12-15 DIAGNOSIS — R7303 Prediabetes: Secondary | ICD-10-CM

## 2014-12-15 DIAGNOSIS — I10 Essential (primary) hypertension: Secondary | ICD-10-CM

## 2014-12-15 DIAGNOSIS — E785 Hyperlipidemia, unspecified: Secondary | ICD-10-CM

## 2014-12-15 LAB — HEPATIC FUNCTION PANEL
ALBUMIN: 4.3 g/dL (ref 3.5–5.2)
ALK PHOS: 59 U/L (ref 39–117)
ALT: 36 U/L (ref 0–53)
AST: 32 U/L (ref 0–37)
Bilirubin, Direct: 0.1 mg/dL (ref 0.0–0.3)
Indirect Bilirubin: 0.6 mg/dL (ref 0.2–1.2)
TOTAL PROTEIN: 6.4 g/dL (ref 6.0–8.3)
Total Bilirubin: 0.7 mg/dL (ref 0.2–1.2)

## 2014-12-15 LAB — BASIC METABOLIC PANEL WITH GFR
BUN: 19 mg/dL (ref 6–23)
CALCIUM: 9.6 mg/dL (ref 8.4–10.5)
CHLORIDE: 102 meq/L (ref 96–112)
CO2: 27 meq/L (ref 19–32)
CREATININE: 1.13 mg/dL (ref 0.50–1.35)
GFR, Est African American: 87 mL/min
GFR, Est Non African American: 75 mL/min
GLUCOSE: 87 mg/dL (ref 70–99)
Potassium: 5.2 mEq/L (ref 3.5–5.3)
Sodium: 139 mEq/L (ref 135–145)

## 2014-12-15 LAB — CBC WITH DIFFERENTIAL/PLATELET
Basophils Absolute: 0 10*3/uL (ref 0.0–0.1)
Basophils Relative: 0 % (ref 0–1)
Eosinophils Absolute: 0.2 10*3/uL (ref 0.0–0.7)
Eosinophils Relative: 3 % (ref 0–5)
HCT: 46.7 % (ref 39.0–52.0)
Hemoglobin: 16.1 g/dL (ref 13.0–17.0)
LYMPHS ABS: 1.8 10*3/uL (ref 0.7–4.0)
Lymphocytes Relative: 32 % (ref 12–46)
MCH: 29.9 pg (ref 26.0–34.0)
MCHC: 34.5 g/dL (ref 30.0–36.0)
MCV: 86.6 fL (ref 78.0–100.0)
MONO ABS: 0.4 10*3/uL (ref 0.1–1.0)
MONOS PCT: 8 % (ref 3–12)
MPV: 10 fL (ref 8.6–12.4)
NEUTROS PCT: 57 % (ref 43–77)
Neutro Abs: 3.1 10*3/uL (ref 1.7–7.7)
PLATELETS: 207 10*3/uL (ref 150–400)
RBC: 5.39 MIL/uL (ref 4.22–5.81)
RDW: 13 % (ref 11.5–15.5)
WBC: 5.5 10*3/uL (ref 4.0–10.5)

## 2014-12-15 LAB — LIPID PANEL
CHOLESTEROL: 225 mg/dL — AB (ref 0–200)
HDL: 34 mg/dL — AB (ref >=40)
LDL Cholesterol: 155 mg/dL — ABNORMAL HIGH (ref 0–99)
Total CHOL/HDL Ratio: 6.6 Ratio
Triglycerides: 181 mg/dL — ABNORMAL HIGH (ref <150)
VLDL: 36 mg/dL (ref 0–40)

## 2014-12-15 LAB — TSH: TSH: 1.998 u[IU]/mL (ref 0.350–4.500)

## 2014-12-15 LAB — MAGNESIUM: MAGNESIUM: 1.9 mg/dL (ref 1.5–2.5)

## 2014-12-15 NOTE — Progress Notes (Signed)
Assessment and Plan:  Hypertension: Continue medication, monitor blood pressure at home. Continue DASH diet.  Reminder to go to the ER if any CP, SOB, nausea, dizziness, severe HA, changes vision/speech, left arm numbness and tingling, and jaw pain. Cholesterol: Continue diet and exercise. Check cholesterol.  Pre-diabetes-Continue diet and exercise. Check A1C Vitamin D Def- check level and continue medications.  Obesity with co morbidities- long discussion about weight loss, diet, and exercise Seb cyst- scheudle removal of cyst.  Otitis effusion- no infection- suggest flonase/nasonex, allergy pills, and hold nose while drinking water to autoinflate, if drainage from ear, fever, chills, HA, nausea, call the office or go to the ER Hypogonadism- continue replacement therapy after get insurance improval, check testosterone levels as needed.    Continue diet and meds as discussed. Further disposition pending results of labs.  HPI 54 y.o. male  presents for 3 month follow up with hypertension, hyperlipidemia, prediabetes and vitamin D.  His blood pressure has been controlled at home, today their BP is BP: 128/82 mmHg  He does not workout due to time limit and decreased energy. He denies chest pain, shortness of breath, dizziness.  He is not on cholesterol medication and denies myalgias. His cholesterol is not at goal. The cholesterol last visit was:   Lab Results  Component Value Date   CHOL 216* 09/30/2013   HDL 33* 09/30/2013   LDLCALC 130* 09/30/2013   TRIG 263* 09/30/2013   CHOLHDL 6.5 09/30/2013    He has been working on diet and exercise for prediabetes, and denies paresthesia of the feet, polydipsia, polyuria and visual disturbances. Last A1C in the office was:  Lab Results  Component Value Date   HGBA1C 5.5 09/30/2013   Patient is on Vitamin D supplement.   Lab Results  Component Value Date   VD25OH 84 09/30/2013     BMI is Body mass index is 29.55 kg/(m^2)., he is working on  diet and exercise. Wt Readings from Last 3 Encounters:  12/15/14 183 lb (83.008 kg)  06/16/14 190 lb (86.183 kg)  12/01/13 190 lb 3.2 oz (86.274 kg)   He has a history of testosterone deficiency and is on testosterone replacement, recently declined by insurance and has not been on it for two month. He states that the testosterone helps with his energy, libido, muscle mass and would like to get back on it. Lab Results  Component Value Date   TESTOSTERONE 300 09/30/2013   Has cyst on back on neck since June, has had removed before.  Has some palpitatoins with stress, nonexertional, no accompaniments  + allergies on zyrtec.   Current Medications:  Current Outpatient Prescriptions on File Prior to Visit  Medication Sig Dispense Refill  . aspirin 81 MG chewable tablet Chew 81 mg by mouth daily.    Marland Kitchen atenolol (TENORMIN) 100 MG tablet TAKE ONE TABLET BY MOUTH ONCE DAILY 90 tablet 1  . Cholecalciferol (VITAMIN D PO) Take 2,000 Units by mouth. Takes 8000 units daily    . fish oil-omega-3 fatty acids 1000 MG capsule Take 1 g by mouth daily.      . Flaxseed, Linseed, (FLAXSEED OIL) 1000 MG CAPS Take by mouth daily.      Marland Kitchen HYDROcodone-acetaminophen (NORCO) 5-325 MG per tablet Take 3 tablets by mouth every 6 (six) hours as needed. For pain    . hydrocortisone (PROCTOZONE-HC) 2.5 % rectal cream Place rectally as needed.    . Magnesium 300 MG CAPS Take by mouth. Takes 3 tablest daily     .  Multiple Vitamin (MULTIVITAMIN) tablet Take 1 tablet by mouth daily.      . Testosterone (ANDROGEL PUMP) 20.25 MG/ACT (1.62%) GEL APPLY TWO PUMPS ONCE DAILY AS DIRECTED 75 g 3   No current facility-administered medications on file prior to visit.   Medical History:  Past Medical History  Diagnosis Date  . Testosterone deficiency   . Hemorrhoids   . Hyperlipidemia   . Hypertension   . Kidney stone   . Vitamin D deficiency   . Allergy    Allergies:  Allergies  Allergen Reactions  . Crestor  [Rosuvastatin]     Elevated CPK  . Lipitor [Atorvastatin]     Myalgias  . Tricor [Fenofibrate]     Myalgias  . Vytorin [Ezetimibe-Simvastatin]     Myalgias  . Zetia [Ezetimibe]      Review of Systems:  Review of Systems  Constitutional: Positive for malaise/fatigue (since off testosterone). Negative for fever, chills, weight loss and diaphoresis.  HENT: Negative.   Eyes: Negative.   Respiratory: Negative.   Cardiovascular: Negative.   Gastrointestinal: Negative.   Genitourinary: Negative.   Musculoskeletal: Negative.   Skin: Negative.        + seb cyst  Neurological: Negative.  Negative for weakness.  Endo/Heme/Allergies: Negative.   Psychiatric/Behavioral: Negative.     Family history- Review and unchanged Social history- Review and unchanged Physical Exam: BP 128/82 mmHg  Pulse 56  Temp(Src) 97.7 F (36.5 C)  Resp 16  Wt 183 lb (83.008 kg) Wt Readings from Last 3 Encounters:  12/15/14 183 lb (83.008 kg)  06/16/14 190 lb (86.183 kg)  12/01/13 190 lb 3.2 oz (86.274 kg)   General Appearance: Well nourished, in no apparent distress. Eyes: PERRLA, EOMs, conjunctiva no swelling or erythema Sinuses: No Frontal/maxillary tenderness ENT/Mouth: Ext aud canals clear, TMs without erythema, bulging. No erythema, swelling, or exudate on post pharynx.  Tonsils not swollen or erythematous. Hearing normal.  Neck: Supple, thyroid normal.  Respiratory: Respiratory effort normal, BS equal bilaterally without rales, rhonchi, wheezing or stridor.  Cardio: RRR with no MRGs. Brisk peripheral pulses without edema.  Abdomen: Soft, + BS,  Non tender, no guarding, rebound, hernias, masses. Lymphatics: Non tender without lymphadenopathy.  Musculoskeletal: Full ROM, 5/5 strength, Normal gait Skin: Warm, dry without rashes, lesions, ecchymosis. Mobile round non tender 2x2 cm cyst on nape of neck.  Neuro: Cranial nerves intact. Normal muscle tone, no cerebellar symptoms. Psych: Awake and  oriented X 3, normal affect, Insight and Judgment appropriate.    Quentin Mullingollier, Klair Leising, PA-C 8:38 AM [facility]

## 2014-12-15 NOTE — Patient Instructions (Addendum)
Before you even begin to attack a weight-loss plan, it pays to remember this: You are not fat. You have fat. Losing weight isn't about blame or shame; it's simply another achievement to accomplish. Dieting is like any other skill-you have to buckle down and work at it. As long as you act in a smart, reasonable way, you'll ultimately get where you want to be. Here are some weight loss pearls for you.  1. It's Not a Diet. It's a Lifestyle Thinking of a diet as something you're on and suffering through only for the short term doesn't work. To shed weight and keep it off, you need to make permanent changes to the way you eat. It's OK to indulge occasionally, of course, but if you cut calories temporarily and then revert to your old way of eating, you'll gain back the weight quicker than you can say yo-yo. Use it to lose it. Research shows that one of the best predictors of long-term weight loss is how many pounds you drop in the first month. For that reason, nutritionists often suggest being stricter for the first two weeks of your new eating strategy to build momentum. Cut out added sugar and alcohol and avoid unrefined carbs. After that, figure out how you can reincorporate them in a way that's healthy and maintainable.  2. There's a Right Way to Exercise Working out burns calories and fat and boosts your metabolism by building muscle. But those trying to lose weight are notorious for overestimating the number of calories they burn and underestimating the amount they take in. Unfortunately, your system is biologically programmed to hold on to extra pounds and that means when you start exercising, your body senses the deficit and ramps up its hunger signals. If you're not diligent, you'll eat everything you burn and then some. Use it to lose it. Cardio gets all the exercise glory, but strength and interval training are the real heroes. They help you build lean muscle, which in turn increases your metabolism and  calorie-burning ability 3. Don't Overreact to Mild Hunger Some people have a hard time losing weight because of hunger anxiety. To them, being hungry is bad-something to be avoided at all costs-so they carry snacks with them and eat when they don't need to. Others eat because they're stressed out or bored. While you never want to get to the point of being ravenous (that's when bingeing is likely to happen), a hunger pang, a craving, or the fact that it's 3:00 p.m. should not send you racing for the vending machine or obsessing about the energy bar in your purse. Ideally, you should put off eating until your stomach is growling and it's difficult to concentrate.  Use it to lose it. When you feel the urge to eat, use the HALT method. Ask yourself, Am I really hungry? Or am I angry or anxious, lonely or bored, or tired? If you're still not certain, try the apple test. If you're truly hungry, an apple should seem delicious; if it doesn't, something else is going on. Or you can try drinking water and making yourself busy, if you are still hungry try a healthy snack.  4. Not All Calories Are Created Equal The mechanics of weight loss are pretty simple: Take in fewer calories than you use for energy. But the kind of food you eat makes all the difference. Processed food that's high in saturated fat and refined starch or sugar can cause inflammation that disrupts the hormone signals that tell  your brain you're full. The result: You eat a lot more.  Use it to lose it. Clean up your diet. Swap in whole, unprocessed foods, including vegetables, lean protein, and healthy fats that will fill you up and give you the biggest nutritional bang for your calorie buck. In a few weeks, as your brain starts receiving regular hunger and fullness signals once again, you'll notice that you feel less hungry overall and naturally start cutting back on the amount you eat.  5. Protein, Produce, and Plant-Based Fats Are Your Weight-Loss  [place] Here's why eating the three Ps regularly will help you drop pounds. Protein fills you up. You need it to build lean muscle, which keeps your metabolism humming so that you can torch more fat. People in a weight-loss program who ate double the recommended daily allowance for protein (about 110 grams for a 150-pound woman) lost 70 percent of their weight from fat, while people who ate the RDA lost only about 40 percent, one study found. Produce is packed with filling fiber. "It's very difficult to consume too many calories if you're eating a lot of vegetables. Example: Three cups of broccoli is a lot of food, yet only 93 calories. (Fruit is another story. It can be easy to overeat and can contain a lot of calories from sugar, so be sure to monitor your intake.) Plant-based fats like olive oil and those in avocados and nuts are healthy and extra satiating.  Use it to lose it. Aim to incorporate each of the three Ps into every meal and snack. People who eat protein throughout the day are able to keep weight off, according to a study in the American Journal of Clinical Nutrition. In addition to meat, poultry and seafood, good sources are beans, lentils, eggs, tofu, and yogurt. As for fat, keep portion sizes in check by measuring out salad dressing, oil, and nut butters (shoot for one to two tablespoons). Finally, eat veggies or a little fruit at every meal. People who did that consumed 308 fewer calories but didn't feel any hungrier than when they didn't eat more produce.  7. How You Eat Is As Important As What You Eat In order for your brain to register that you're full, you need to focus on what you're eating. Sit down whenever you eat, preferably at a table. Turn off the TV or computer, put down your phone, and look at your food. Smell it. Chew slowly, and don't put another bite on your fork until you swallow. When women ate lunch this attentively, they consumed 30 percent less when snacking later than  those who listened to an audiobook at lunchtime, according to a study in the British Journal of Nutrition. 8. Weighing Yourself Really Works The scale provides the best evidence about whether your efforts are paying off. Seeing the numbers tick up or down or stagnate is motivation to keep going-or to rethink your approach. A 2015 study at [employer] found that daily weigh-ins helped people lose more weight, keep it off, and maintain that loss, even after two years. Use it to lose it. Step on the scale at the same time every day for the best results. If your weight shoots up several pounds from one weigh-in to the next, don't freak out. Eating a lot of salt the night before or having your period is the likely culprit. The number should return to normal in a day or two. It's a steady climb that you need to do something about.   9. Too Much Stress and Too Little Sleep Are Your Enemies When you're tired and frazzled, your body cranks up the production of cortisol, the stress hormone that can cause carb cravings. Not getting enough sleep also boosts your levels of ghrelin, a hormone associated with hunger, while suppressing leptin, a hormone that signals fullness and satiety. People on a diet who slept only five and a half hours a night for two weeks lost 55 percent less fat and were hungrier than those who slept eight and a half hours, according to a study in the [place]. Use it to lose it. Prioritize sleep, aiming for seven hours or more a night, which research shows helps lower stress. And make sure you're getting quality zzz's. If a snoring spouse or a fidgety cat wakes you up frequently throughout the night, you may end up getting the equivalent of just four hours of sleep, according to a study from [facility]. Keep pets out of the bedroom, and use a white-noise app to drown out snoring. 10. You Will Hit a plateau-And You Can Bust Through It As you slim down, your  body releases much less leptin, the fullness hormone.  If you're not strength training, start right now. Building muscle can raise your metabolism to help you overcome a plateau. To keep your body challenged and burning calories, incorporate new moves and more intense intervals into your workouts or add another sweat session to your weekly routine. Alternatively, cut an extra 100 calories or so a day from your diet. Now that you've lost weight, your body simply doesn't need as much fuel.   9 Ways to Naturally Increase Testosterone Levels  1.   Lose Weight If you're overweight, shedding the excess pounds may increase your testosterone levels, according to research presented at the Endocrine Society's 2012 meeting. Overweight men are more likely to have low testosterone levels to begin with, so this is an important trick to increase your body's testosterone production when you need it most.  2.   High-Intensity Exercise like Peak Fitness  Short intense exercise has a proven positive effect on increasing testosterone levels and preventing its decline. That's unlike aerobics or prolonged moderate exercise, which have shown to have negative or no effect on testosterone levels. Having a whey protein meal after exercise can further enhance the satiety/testosterone-boosting impact (hunger hormones cause the opposite effect on your testosterone and libido). Here's a summary of what a typical high-intensity Peak Fitness routine might look like: " Warm up for three minutes  " Exercise as hard and fast as you can for 30 seconds. You should feel like you couldn't possibly go on another few seconds  " Recover at a slow to moderate pace for 90 seconds  " Repeat the high intensity exercise and recovery 7 more times .  3.   Consume Plenty of Zinc The mineral zinc is important for testosterone production, and supplementing your diet for as little as six weeks has been shown to cause a marked improvement in  testosterone among men with low levels.1 Likewise, research has shown that restricting dietary sources of zinc leads to a significant decrease in testosterone, while zinc supplementation increases it2 -- and even protects men from exercised-induced reductions in testosterone levels.3 It's estimated that up to 58 percent of adults over the age of 60 may have lower than recommended zinc intakes; even when dietary supplements were added in, an estimated 20-25 percent of older adults still had inadequate zinc intakes,  according to a [employer] and Nutrition Examination Survey.4 Your diet is the best source of zinc; along with protein-rich foods like meats and fish, other good dietary sources of zinc include raw milk, raw cheese, beans, and yogurt or kefir made from raw milk. It can be difficult to obtain enough dietary zinc if you're a vegetarian, and also for meat-eaters as well, largely because of conventional farming methods that rely heavily on chemical fertilizers and pesticides. These chemicals deplete the soil of nutrients ... nutrients like zinc that must be absorbed by plants in order to be passed on to you. In many cases, you may further deplete the nutrients in your food by the way you prepare it. For most food, cooking it will drastically reduce its levels of nutrients like zinc . particularly over-cooking, which many people do. If you decide to use a zinc supplement, stick to a dosage of less than 40 mg a day, as this is the recommended adult upper limit. Taking too much zinc can interfere with your body's ability to absorb other minerals, especially copper, and may cause nausea as a side effect.  4.   Strength Training In addition to Peak Fitness, strength training is also known to boost testosterone levels, provided you are doing so intensely enough. When strength training to boost testosterone, you'll want to increase the weight and lower your number of reps, and then focus on exercises that  work a large number of muscles, such as dead lifts or squats.  You can "turbo-charge" your weight training by going slower. By slowing down your movement, you're actually turning it into a high-intensity exercise. Super Slow movement allows your muscle, at the microscopic level, to access the maximum number of cross-bridges between the protein filaments that produce movement in the muscle.   5.   Optimize Your Vitamin D Levels Vitamin D, a steroid hormone, is essential for the healthy development of the nucleus of the sperm cell, and helps maintain semen quality and sperm count. Vitamin D also increases levels of testosterone, which may boost libido. In one study, overweight men who were given vitamin D supplements had a significant increase in testosterone levels after one year.5   6.   Reduce Stress When you're under a lot of stress, your body releases high levels of the stress hormone cortisol. This hormone actually blocks the effects of testosterone,6 presumably because, from a biological standpoint, testosterone-associated behaviors (mating, competing, aggression) may have lowered your chances of survival in an emergency (hence, the "fight or flight" response is dominant, courtesy of cortisol).  7.   Limit or Eliminate Sugar from Your Diet Testosterone levels decrease after you eat sugar, which is likely because the sugar leads to a high insulin level, another factor leading to low testosterone.7 Based on USDA estimates, the average American consumes 12 teaspoons of sugar a day, which equates to about TWO TONS of sugar during a lifetime.  8.   Eat Healthy Fats By healthy, this means not only mon- and polyunsaturated fats, like that found in avocadoes and nuts, but also saturated, as these are essential for building testosterone. Research shows that a diet with less than 40 percent of energy as fat (and that mainly from animal sources, i.e. saturated) lead to a decrease in testosterone levels.8 My  personal diet is about 60-70 percent healthy fat, and other experts agree that the ideal diet includes somewhere between 50-70 percent fat.  It's important to understand that your body requires saturated fats from animal  and vegetable sources (such as meat, dairy, certain oils, and tropical plants like coconut) for optimal functioning, and if you neglect this important food group in favor of sugar, grains and other starchy carbs, your health and weight are almost guaranteed to suffer. Examples of healthy fats you can eat more of to give your testosterone levels a boost include: Olives and Olive oil  Coconuts and coconut oil Butter made from raw grass-fed organic milk Raw nuts, such as, almonds or pecans Organic pastured egg yolks Avocados Grass-fed meats Palm oil Unheated organic nut oils   9.   Boost Your Intake of Branch Chain Amino Acids (BCAA) from Foods Like Whey Protein Research suggests that BCAAs result in higher testosterone levels, particularly when taken along with resistance training.9 While BCAAs are available in supplement form, you'll find the highest concentrations of BCAAs like leucine in dairy products - especially quality cheeses and whey protein. Even when getting leucine from your natural food supply, it's often wasted or used as a building block instead of an anabolic agent. So to create the correct anabolic environment, you need to boost leucine consumption way beyond mere maintenance levels. That said, keep in mind that using leucine as a free form amino acid can be highly counterproductive as when free form amino acids are artificially administrated, they rapidly enter your circulation while disrupting insulin function, and impairing your body's glycemic control. Food-based leucine is really the ideal form that can benefit your muscles without side effects.   Use a dropper or use a cap to put olive oil,mineral oil or canola oil in the effected ear- 2-3 times a week. Let it soak  for 20-30 min then you can take a shower or use a baby bulb with warm water to wash out the ear wax.  Do not use Qtips  Your ears and sinuses are connected by the eustachian tube. When your sinuses are inflamed, this can close off the tube and cause fluid to collect in your middle ear. This can then cause dizziness, popping, clicking, ringing, and echoing in your ears. This is often NOT an infection and does NOT require antibiotics, it is caused by inflammation so the treatments help the inflammation. This can take a long time to get better so please be patient.  Here are things you can do to help with this: - Try the Flonase or Nasonex. Remember to spray each nostril twice towards the outer part of your eye.  Do not sniff but instead pinch your nose and tilt your head back to help the medicine get into your sinuses.  The best time to do this is at bedtime.Stop if you get blurred vision or nose bleeds.  -While drinking fluids, pinch and hold nose close and swallow, to help open eustachian tubes to drain fluid behind ear drums. -Please pick one of the over the counter allergy medications below and take it once daily for allergies.  It will also help with fluid behind ear drums. Claritin or loratadine cheapest but likely the weakest  Zyrtec or certizine at night because it can make you sleepy The strongest is allegra or fexafinadine  Cheapest at [employer], [employer], [employer] -can use decongestant over the counter, please do not use if you have high blood pressure or certain heart conditions.   if worsening HA, changes vision/speech, imbalance, weakness go to the ER

## 2014-12-16 LAB — TESTOSTERONE: Testosterone: 239 ng/dL — ABNORMAL LOW (ref 300–890)

## 2014-12-16 LAB — VITAMIN D 25 HYDROXY (VIT D DEFICIENCY, FRACTURES): Vit D, 25-Hydroxy: 83 ng/mL (ref 30–100)

## 2014-12-18 ENCOUNTER — Other Ambulatory Visit: Payer: Self-pay | Admitting: Physician Assistant

## 2014-12-18 MED ORDER — TESTOSTERONE 20.25 MG/ACT (1.62%) TD GEL: TRANSDERMAL | Status: DC

## 2015-01-19 ENCOUNTER — Ambulatory Visit (INDEPENDENT_AMBULATORY_CARE_PROVIDER_SITE_OTHER): Payer: BLUE CROSS/BLUE SHIELD | Admitting: Internal Medicine

## 2015-01-19 ENCOUNTER — Encounter: Payer: Self-pay | Admitting: Internal Medicine

## 2015-01-19 ENCOUNTER — Other Ambulatory Visit: Payer: Self-pay | Admitting: Physician Assistant

## 2015-01-19 VITALS — BP 144/90 | HR 60 | Temp 98.6°F | Resp 16 | Ht 66.75 in | Wt 191.4 lb

## 2015-01-19 DIAGNOSIS — I10 Essential (primary) hypertension: Secondary | ICD-10-CM

## 2015-01-19 DIAGNOSIS — L723 Sebaceous cyst: Secondary | ICD-10-CM

## 2015-01-20 ENCOUNTER — Encounter: Payer: Self-pay | Admitting: Internal Medicine

## 2015-01-20 DIAGNOSIS — L723 Sebaceous cyst: Secondary | ICD-10-CM | POA: Insufficient documentation

## 2015-01-20 NOTE — Progress Notes (Signed)
   Subjective:    Patient ID: Sean Vasquez, male    DOB: 1961-09-03, 54 y.o.   MRN: [id number]  HPI  Patient presents for excision of Sebaceous cyst at the nape of his neck    Review of Systems     Objective:   Physical Exam BP 144/90 mmHg  Pulse 60  Temp(Src) 98.6 F (37 C)  Resp 16  Ht 5' 6.75" (1.695 m)  Wt 191 lb 6.4 oz (86.818 kg)  BMI 30.22 kg/m2 HEENT - Eac's patent. TM's Nl. EOM's full. PERRLA. NasoOroPharynx clear. Neck - supple. Nl Thyroid. Carotids 2+ & No bruits, nodes, JVD Chest - Clear equal BS w/o Rales, rhonchi, wheezes. Cor - Nl HS. RRR w/o sig MGR. PP 1(+). No edema. Abd - No palpable organomegaly, masses or tenderness. BS nl. MS- FROM w/o deformities. Muscle power, tone and bulk Nl. Gait Nl. Neuro - No obvious Cr N abnormalities. Sensory, motor and Cerebellar functions appear Nl w/o focal abnormalities. Psyche - Mental status normal & appropriate.  No delusions, ideations or obvious mood abnormalities. Skin - there is an approx 1.25 x 1.75 sl tender soft cystic mass at the nape of the neck.  After informed consent and aseptic prep with  Alcohol, the lesion at the nape of the neck was anesthetized locally with Marcaine 0.5% w/Epi and then with a #10 scalpel the area in question was excised full thickness in toto and with deliverance of a 1.25 x 1.75 cm cyst and the cyst cavity was quenched with H.Peroxide. Then the wound was closed with # 2 vertical mattress sutures o Nylon 3-0  to approximate and evert the wound edges. A bridal was inserted to salvage the 2 cutaneous sutures. Then a 2 " x 3 " tedoderm was applied to the area & patient was instructed in wound care    Assessment & Plan:   1. Essential hypertension  - Monitor BP's closely  2. Sebaceous cyst, Nape of Neck  - post excision  - ROV 10 days for suture removal

## 2015-01-29 ENCOUNTER — Ambulatory Visit: Payer: BLUE CROSS/BLUE SHIELD | Admitting: Internal Medicine

## 2015-01-29 VITALS — BP 136/80 | HR 56 | Temp 97.7°F | Resp 16 | Ht 66.75 in | Wt 189.9 lb

## 2015-01-29 DIAGNOSIS — L723 Sebaceous cyst: Secondary | ICD-10-CM

## 2015-01-30 ENCOUNTER — Encounter: Payer: Self-pay | Admitting: Internal Medicine

## 2015-01-30 NOTE — Progress Notes (Signed)
   Subjective:    Patient ID: Sean Vasquez, male    DOB: [birth year], 54 y.o.   MRN: [id number]  HPI Patient returns for suture removal from nape of neck s/p excision of a sebaceous cyst  Review of Systems Neg     Objective:   Physical Exam  BP 136/80 mmHg  Pulse 56  Temp(Src) 97.7 F (36.5 C)  Resp 16  Ht 5' 6.75" (1.695 m)  Wt 189 lb 14.4 oz (86.138 kg)  BMI 29.98 kg/m2  Wound well-healed w/o signs of infection and has a good healing ridge. All sutures are removed.    Assessment & Plan:   1. Sebaceous cyst, nape of neck - excised

## 2015-03-30 ENCOUNTER — Ambulatory Visit (INDEPENDENT_AMBULATORY_CARE_PROVIDER_SITE_OTHER): Payer: BLUE CROSS/BLUE SHIELD | Admitting: Physician Assistant

## 2015-03-30 ENCOUNTER — Encounter: Payer: Self-pay | Admitting: Internal Medicine

## 2015-03-30 ENCOUNTER — Encounter: Payer: Self-pay | Admitting: Physician Assistant

## 2015-03-30 VITALS — BP 140/86 | HR 58 | Temp 98.2°F | Resp 16 | Ht 66.75 in | Wt 189.0 lb

## 2015-03-30 DIAGNOSIS — E785 Hyperlipidemia, unspecified: Secondary | ICD-10-CM

## 2015-03-30 DIAGNOSIS — Z79899 Other long term (current) drug therapy: Secondary | ICD-10-CM

## 2015-03-30 DIAGNOSIS — E559 Vitamin D deficiency, unspecified: Secondary | ICD-10-CM

## 2015-03-30 DIAGNOSIS — I10 Essential (primary) hypertension: Secondary | ICD-10-CM

## 2015-03-30 DIAGNOSIS — E291 Testicular hypofunction: Secondary | ICD-10-CM

## 2015-03-30 DIAGNOSIS — R7309 Other abnormal glucose: Secondary | ICD-10-CM

## 2015-03-30 DIAGNOSIS — E349 Endocrine disorder, unspecified: Secondary | ICD-10-CM

## 2015-03-30 DIAGNOSIS — R7303 Prediabetes: Secondary | ICD-10-CM

## 2015-03-30 LAB — CBC WITH DIFFERENTIAL/PLATELET
Basophils Absolute: 0.1 10*3/uL (ref 0.0–0.1)
Basophils Relative: 1 % (ref 0–1)
EOS ABS: 0.2 10*3/uL (ref 0.0–0.7)
EOS PCT: 4 % (ref 0–5)
HEMATOCRIT: 46.1 % (ref 39.0–52.0)
HEMOGLOBIN: 16.1 g/dL (ref 13.0–17.0)
LYMPHS ABS: 2.1 10*3/uL (ref 0.7–4.0)
Lymphocytes Relative: 35 % (ref 12–46)
MCH: 29.9 pg (ref 26.0–34.0)
MCHC: 34.9 g/dL (ref 30.0–36.0)
MCV: 85.7 fL (ref 78.0–100.0)
MPV: 9.9 fL (ref 8.6–12.4)
Monocytes Absolute: 0.5 10*3/uL (ref 0.1–1.0)
Monocytes Relative: 8 % (ref 3–12)
NEUTROS PCT: 52 % (ref 43–77)
Neutro Abs: 3.1 10*3/uL (ref 1.7–7.7)
Platelets: 221 10*3/uL (ref 150–400)
RBC: 5.38 MIL/uL (ref 4.22–5.81)
RDW: 13.1 % (ref 11.5–15.5)
WBC: 6 10*3/uL (ref 4.0–10.5)

## 2015-03-30 LAB — HEMOGLOBIN A1C
HEMOGLOBIN A1C: 5.3 % (ref <5.7)
Mean Plasma Glucose: 105 mg/dL (ref <117)

## 2015-03-30 MED ORDER — TESTOSTERONE CYPIONATE 200 MG/ML IM SOLN: INTRAMUSCULAR | Status: DC

## 2015-03-30 MED ORDER — "NEEDLE (DISP) 21G X 1"" MISC": Status: AC

## 2015-03-30 NOTE — Progress Notes (Signed)
Assessment and Plan:  Hypertension: Continue medication, monitor blood pressure at home. Continue DASH diet.  Reminder to go to the ER if any CP, SOB, nausea, dizziness, severe HA, changes vision/speech, left arm numbness and tingling, and jaw pain. Cholesterol: Continue diet and exercise. Check cholesterol.  Pre-diabetes-Continue diet and exercise. Check A1C Vitamin D Def- check level and continue medications.  Obesity with co morbidities- long discussion about weight loss, diet, and exercise Hypogonadism- continue replacement therapy, will try the injections, wants to do himself, will bring in for teaching, check testosterone levels as needed.    Continue diet and meds as discussed. Further disposition pending results of labs.  HPI 54 y.o. male  presents for 3 month follow up with hypertension, hyperlipidemia, prediabetes and vitamin D.  His blood pressure has been controlled at home, today their BP is BP: 140/86 mmHg  He does not workout due to time limit and decreased energy. He denies chest pain, shortness of breath, dizziness.  He is not on cholesterol medication, unable to tolerate statins.  His cholesterol is not at goal. The cholesterol last visit was:   Lab Results  Component Value Date   CHOL 225* 12/15/2014   HDL 34* 12/15/2014   LDLCALC 155* 12/15/2014   TRIG 181* 12/15/2014   CHOLHDL 6.6 12/15/2014    He has been working on diet and exercise for prediabetes, and denies paresthesia of the feet, polydipsia, polyuria and visual disturbances. Last A1C in the office was:  Lab Results  Component Value Date   HGBA1C 5.5 09/30/2013   Patient is on Vitamin D supplement.   Lab Results  Component Value Date   VD25OH 83 12/15/2014     BMI is Body mass index is 29.84 kg/(m^2)., he is working on diet and exercise. Wt Readings from Last 3 Encounters:  03/30/15 189 lb (85.73 kg)  01/29/15 189 lb 14.4 oz (86.138 kg)  01/19/15 191 lb 6.4 oz (86.818 kg)   He has a history of  testosterone deficiency and is on testosterone replacement, recently declined by insurance and has not been on it for two month. He states that the testosterone helps with his energy, libido, muscle mass and would like to get back on it but he is very frustrated with his insurance. Lab Results  Component Value Date   TESTOSTERONE 239* 12/15/2014   He has increased stress, his mom is at [facility], calls daily.   Current Medications:  Current Outpatient Prescriptions on File Prior to Visit  Medication Sig Dispense Refill  . aspirin 81 MG chewable tablet Chew 81 mg by mouth daily.    Marland Kitchen atenolol (TENORMIN) 100 MG tablet TAKE ONE TABLET BY MOUTH ONCE DAILY 90 tablet 0  . Cholecalciferol (VITAMIN D PO) Take 2,000 Units by mouth. Takes 8000 units daily    . fish oil-omega-3 fatty acids 1000 MG capsule Take 1 g by mouth daily.      . Flaxseed, Linseed, (FLAXSEED OIL) 1000 MG CAPS Take by mouth daily.      Marland Kitchen HYDROcodone-acetaminophen (NORCO) 5-325 MG per tablet Take 3 tablets by mouth every 6 (six) hours as needed. For pain    . hydrocortisone (PROCTOZONE-HC) 2.5 % rectal cream Place rectally as needed.    . Magnesium 300 MG CAPS Take by mouth. Takes 3 tablest daily     . Multiple Vitamin (MULTIVITAMIN) tablet Take 1 tablet by mouth daily.      . Testosterone (ANDROGEL PUMP) 20.25 MG/ACT (1.62%) GEL APPLY TWO PUMPS ONCE DAILY  AS DIRECTED 75 g 3   No current facility-administered medications on file prior to visit.   Medical History:  Past Medical History  Diagnosis Date  . Testosterone deficiency   . Hemorrhoids   . Hyperlipidemia   . Hypertension   . Kidney stone   . Vitamin D deficiency   . Allergy    Allergies:  Allergies  Allergen Reactions  . Crestor [Rosuvastatin]     Elevated CPK  . Lipitor [Atorvastatin]     Myalgias  . Tricor [Fenofibrate]     Myalgias  . Vytorin [Ezetimibe-Simvastatin]     Myalgias  . Zetia [Ezetimibe]      Review of Systems:  Review of Systems   Constitutional: Positive for malaise/fatigue (since off testosterone). Negative for fever, chills, weight loss and diaphoresis.  HENT: Negative.   Eyes: Negative.   Respiratory: Negative.   Cardiovascular: Negative.   Gastrointestinal: Negative.   Genitourinary: Negative.   Musculoskeletal: Negative.   Skin: Negative.   Neurological: Negative.  Negative for weakness.  Endo/Heme/Allergies: Negative.   Psychiatric/Behavioral: Negative.     Family history- Review and unchanged Social history- Review and unchanged Physical Exam: BP 140/86 mmHg  Pulse 58  Temp(Src) 98.2 F (36.8 C) (Temporal)  Resp 16  Ht 5' 6.75" (1.695 m)  Wt 189 lb (85.73 kg)  BMI 29.84 kg/m2 Wt Readings from Last 3 Encounters:  03/30/15 189 lb (85.73 kg)  01/29/15 189 lb 14.4 oz (86.138 kg)  01/19/15 191 lb 6.4 oz (86.818 kg)   General Appearance: Well nourished, in no apparent distress. Eyes: PERRLA, EOMs, conjunctiva no swelling or erythema Sinuses: No Frontal/maxillary tenderness ENT/Mouth: Ext aud canals clear, TMs without erythema, bulging. No erythema, swelling, or exudate on post pharynx.  Tonsils not swollen or erythematous. Hearing normal.  Neck: Supple, thyroid normal.  Respiratory: Respiratory effort normal, BS equal bilaterally without rales, rhonchi, wheezing or stridor.  Cardio: RRR with no MRGs. Brisk peripheral pulses without edema.  Abdomen: Soft, + BS,  Non tender, no guarding, rebound, hernias, masses. Lymphatics: Non tender without lymphadenopathy.  Musculoskeletal: Full ROM, 5/5 strength, Normal gait Skin: Warm, dry without rashes, lesions, ecchymosis.  Neuro: Cranial nerves intact. Normal muscle tone, no cerebellar symptoms. Psych: Awake and oriented X 3, normal affect, Insight and Judgment appropriate.    Quentin Mulling, PA-C 9:27 AM [facility]

## 2015-03-30 NOTE — Patient Instructions (Signed)
Benefiber is good for constipation/diarrhea/irritable bowel syndrome, it helps with weight loss and can help lower your bad cholesterol. Please do 1-2 TBSP in the morning in water, coffee, or tea. It can take up to a month before you can see a difference with your bowel movements. It is cheapest from [employer], [employer], [employer].   Cholesterol Cholesterol is a fat. Your body needs a small amount of cholesterol. Cholesterol may build up in your blood vessels. This increases your chance of having a heart attack or stroke. You cannot feel your cholesterol levels. The only way to know your cholesterol level is high is with a blood test. Keep your test results. Work with your doctor to keep your cholesterol at a good level. WHAT DO THE TEST RESULTS MEAN?  Total cholesterol is how much cholesterol is in your blood.  LDL is bad cholesterol. This is the type that can build up. You want LDL to be low.  HDL is good cholesterol. It cleans your blood vessels and carries LDL away. You want HDL to be high.  Triglycerides are fat that the body can burn for energy or store. WHAT ARE GOOD LEVELS OF CHOLESTEROL?  Total cholesterol below 200.  LDL below 100 for people at risk. Below 70 for those at very high risk.  HDL above 50 is good. Above 60 is best.  Triglycerides below 150. HOW CAN I LOWER MY CHOLESTEROL?  Diet. Follow your diet programs as told by your doctor.  Choose fish, white meat chicken, roasted [place], or baked [place]. Try not to eat red meat, fried foods, or processed meats such as sausage and lunch meats.  Eat lots of fresh fruits and vegetables.  Choose whole grains, beans, pasta, potatoes, and cereals.  Use only small amounts of olive, corn, or canola oils.  Try not to eat butter, mayonnaise, shortening, or palm kernel oils.  Try not to eat foods with trans fats.  Drink skim or nonfat milk. Eat low-fat or nonfat yogurt and cheeses. Try not to drink whole milk or cream. Try not to eat  ice cream, egg yolks, and full-fat cheeses.  Healthy desserts include angel food cake, ginger snaps, animal crackers, hard candy, popsicles, and low-fat or nonfat frozen yogurt. Try not to eat pastries, cakes, pies, and cookies.  Exercise. Follow your exercise programs as told by your doctor.  Be more active. You can try gardening, walking, or taking the stairs. Ask your doctor about how you can be more active.  Medicine. Take medicine as told by your doctor. Document Released: 12/29/2008 Document Revised: 02/16/2014 Document Reviewed: 07/16/2013 [facility] Patient Information 2015 [place], [place]. This information is not intended to replace advice given to you by your health care provider. Make sure you discuss any questions you have with your health care provider.  Alzheimer Disease Caregiver Guide Alzheimer disease is an illness that affects a person's brain. It causes a person to lose the ability to remember things and make good decisions. As the disease progresses, the person is unable to take care of himself or herself and needs more and more help to do simple tasks. Taking care of someone with Alzheimer disease can be very challenging and overwhelming.  MEMORY LOSS AND CONFUSION Memory loss and confusion is mild in the beginning stages of the disease. Both of these problems become more severe as the disease progresses. Eventually, the person will not recognize places or even close family members and friends.   Stay calm.  Respond with a short explanation.  Long explanations can be overwhelming and confusing.  Avoid corrections that sound like scolding.  Try not to take it personally, even if the person forgets your name. BEHAVIOR CHANGES Behavior changes are part of the disease. The person may develop depression, anxiety, anger, hallucinations, or other behavior changes. These changes can come on suddenly and may be in response to pain, infection, changes in the environment (temperature,  noise), overstimulation, or feeling lost or scared.   Try not to take behavior changes personally.  Remain calm and patient.  Do not argue or try to convince the person about a specific point. This will only make him or her more agitated.  Know that the behavior changes are part of the disease process and try to work through it. TIPS TO REDUCE FRUSTRATION  Schedule wisely by making appointments and doing daily tasks, like bathing and dressing, when the person is at his or her best.  Take your time. Simple tasks may take a lot longer, so be sure to allow for plenty of time.  Limit choices. Too many choices can be overwhelming and stressful for the person.  Involve the person in what you are doing.  Stick to a routine.  Avoid new or crowded situations, if possible.  Use simple words, short sentences, and a calm voice. Only give one direction at a time.  Buy clothes and shoes that are easy to put on and take off.  Let people help if they offer. HOME SAFETY Keeping the home safe is very important to reduce the risk of falls and injuries.   Keep floors clear of clutter. Remove rugs, magazine racks, and floor lamps.  Keep hallways well lit.  Put a handrail and nonslip mat in the bathtub or shower.  Put childproof locks on cabinets with dangerous items, such as medicine, alcohol, guns, toxic cleaning items, sharp tools or utensils, matches, or lighters.  Place locks on doors where the person cannot easily see or reach them. This helps ensure that the person cannot wander out of the house and get lost.  Be prepared for emergencies. Keep a list of emergency phone numbers and addresses in a convenient area. PLANS FOR THE FUTURE  Do not put off talking about finances.  Talk about money management. People with Alzheimer disease have trouble managing their money as the disease gets worse.  Get help from professional advisors regarding financial and legal matters.  Do not put off  talking about future care.  Choose a power of attorney. This is someone who can make decisions for the person with Alzheimer disease when he or she is no longer able to do so.  Talk about driving and when it is the right time to stop. The person's health care provider can help give advice on this matter.  Talk about the person's living situation. If he or she lives alone, you need to make sure he or she is safe. Some people need extra help at home, and others need more care at a nursing home or care center. SUPPORT GROUPS Joining a support group can be very helpful for caregivers of people with Alzheimer disease. Some advantages to being part of a support group include:   Getting strategies to manage stress.  Sharing experiences with others.  Receiving emotional comfort and support.  Learning new caregiving skills as the disease progresses.  Knowing what community resources are available and taking advantage of them. SEEK MEDICAL CARE IF:  The person has a fever.  The person has  a sudden change in behavior that does not improve with calming strategies.  The person is unable to manage in his or her current living situation.  The person threatens you or anyone else, including himself or herself.  You are no longer able to care for the person. Document Released: 06/13/2004 Document Revised: 02/16/2014 Document Reviewed: 11/08/2011 [facility] Patient Information 2015 [place], [place]. This information is not intended to replace advice given to you by your health care provider. Make sure you discuss any questions you have with your health care provider.

## 2015-03-31 LAB — MAGNESIUM: Magnesium: 1.9 mg/dL (ref 1.5–2.5)

## 2015-03-31 LAB — HEPATIC FUNCTION PANEL
ALBUMIN: 4.1 g/dL (ref 3.5–5.2)
ALK PHOS: 56 U/L (ref 39–117)
ALT: 35 U/L (ref 0–53)
AST: 26 U/L (ref 0–37)
BILIRUBIN INDIRECT: 0.3 mg/dL (ref 0.2–1.2)
BILIRUBIN TOTAL: 0.4 mg/dL (ref 0.2–1.2)
Bilirubin, Direct: 0.1 mg/dL (ref 0.0–0.3)
Total Protein: 6.3 g/dL (ref 6.0–8.3)

## 2015-03-31 LAB — VITAMIN D 25 HYDROXY (VIT D DEFICIENCY, FRACTURES): Vit D, 25-Hydroxy: 71 ng/mL (ref 30–100)

## 2015-03-31 LAB — LIPID PANEL
Cholesterol: 246 mg/dL — ABNORMAL HIGH (ref 0–200)
HDL: 29 mg/dL — AB (ref >=40)
LDL Cholesterol: 147 mg/dL — ABNORMAL HIGH (ref 0–99)
Total CHOL/HDL Ratio: 8.5 Ratio
Triglycerides: 349 mg/dL — ABNORMAL HIGH (ref <150)
VLDL: 70 mg/dL — ABNORMAL HIGH (ref 0–40)

## 2015-03-31 LAB — BASIC METABOLIC PANEL WITH GFR
BUN: 17 mg/dL (ref 6–23)
CO2: 25 mEq/L (ref 19–32)
Calcium: 9.2 mg/dL (ref 8.4–10.5)
Chloride: 103 mEq/L (ref 96–112)
Creat: 1.01 mg/dL (ref 0.50–1.35)
GFR, EST NON AFRICAN AMERICAN: 86 mL/min
GFR, Est African American: >89 mL/min
Glucose, Bld: 87 mg/dL (ref 70–99)
POTASSIUM: 4.6 meq/L (ref 3.5–5.3)
SODIUM: 139 meq/L (ref 135–145)

## 2015-03-31 LAB — INSULIN, FASTING: Insulin fasting, serum: 17.5 u[IU]/mL (ref 2.0–19.6)

## 2015-03-31 LAB — TSH: TSH: 2.701 u[IU]/mL (ref 0.350–4.500)

## 2015-04-05 ENCOUNTER — Ambulatory Visit (INDEPENDENT_AMBULATORY_CARE_PROVIDER_SITE_OTHER): Payer: BLUE CROSS/BLUE SHIELD

## 2015-04-05 DIAGNOSIS — E291 Testicular hypofunction: Secondary | ICD-10-CM

## 2015-04-05 MED ORDER — TESTOSTERONE CYPIONATE 200 MG/ML IM SOLN
300.0000 mg | Freq: Once | INTRAMUSCULAR | Status: AC
  Administered 2015-04-05: 300 mg via INTRAMUSCULAR

## 2015-04-05 MED ORDER — TESTOSTERONE CYPIONATE 200 MG/ML IM SOLN: 400.0000 mg | Freq: Once | INTRAMUSCULAR | Status: DC

## 2015-04-05 NOTE — Progress Notes (Signed)
Patient ID: Sean Vasquez, male   DOB: [birth year], 54 y.o.   MRN: [id number] Patient here today for testosterone injection and teaching as to how to inject himself. Patient received 1.5 mL IM right glut. He will receive 1.5 mL every three weeks for 2 injections. Before third he was instructed to call the office and schedule a lab visit to check testosterone and we will advise if there is any adjustment to the dose. Patient verbalized understanding of all instructions and tolerated injection well.

## 2015-04-24 ENCOUNTER — Other Ambulatory Visit: Payer: Self-pay | Admitting: Internal Medicine

## 2015-04-29 ENCOUNTER — Other Ambulatory Visit: Payer: BLUE CROSS/BLUE SHIELD

## 2015-04-29 DIAGNOSIS — E291 Testicular hypofunction: Secondary | ICD-10-CM

## 2015-04-29 LAB — TESTOSTERONE: TESTOSTERONE: 720 ng/dL (ref 300–890)

## 2015-05-07 ENCOUNTER — Encounter: Payer: Self-pay | Admitting: Internal Medicine

## 2015-05-07 ENCOUNTER — Ambulatory Visit (INDEPENDENT_AMBULATORY_CARE_PROVIDER_SITE_OTHER): Payer: BLUE CROSS/BLUE SHIELD | Admitting: Internal Medicine

## 2015-05-07 VITALS — BP 144/88 | HR 66 | Temp 98.2°F | Resp 18 | Ht 66.75 in | Wt 198.0 lb

## 2015-05-07 DIAGNOSIS — H6123 Impacted cerumen, bilateral: Secondary | ICD-10-CM

## 2015-05-07 DIAGNOSIS — H6591 Unspecified nonsuppurative otitis media, right ear: Secondary | ICD-10-CM

## 2015-05-07 MED ORDER — PREDNISONE 20 MG PO TABS: ORAL_TABLET | ORAL | Status: DC

## 2015-05-07 NOTE — Patient Instructions (Signed)

## 2015-05-07 NOTE — Progress Notes (Signed)
   Subjective:    Patient ID: Sean Vasquez, male    DOB: Sep 10, 1961, 54 y.o.   MRN: [id number]  Otalgia  Associated symptoms include rhinorrhea. Pertinent negatives include no coughing, ear discharge, sore throat or vomiting.  Patient presents to the office for evaluation of right ear pain which has been going on for 4-5 days.  He reports that he tends to have weekly peroxide and mineral oils to keep them clean.  Pain is moderate and keeps him up.  Nothing seems to really make the ear hurt a lot worse.  He feels like the ear is muffled.      Review of Systems  Constitutional: Negative for fever, chills and fatigue.  HENT: Positive for congestion, ear pain, postnasal drip and rhinorrhea. Negative for ear discharge, sinus pressure, sore throat and voice change.   Respiratory: Negative for cough and shortness of breath.   Gastrointestinal: Negative for nausea and vomiting.       Objective:   Physical Exam  Constitutional: He appears well-developed and well-nourished. No distress.  HENT:  Head: Normocephalic.  Right Ear: A middle ear effusion is present.  Mouth/Throat: Oropharynx is clear and moist. No oropharyngeal exudate.  Wax impaction to the left ear.  Poor visualization due to hair in the ears.    Eyes: Conjunctivae are normal. No scleral icterus.  Neck: Normal range of motion. Neck supple. No JVD present. No thyromegaly present.  Cardiovascular: Normal rate and regular rhythm.  Exam reveals no gallop and no friction rub.   Murmur heard. Pulmonary/Chest: Effort normal and breath sounds normal. No respiratory distress. He has no wheezes. He has no rales. He exhibits no tenderness.  Abdominal: Soft. Bowel sounds are normal.  Lymphadenopathy:    He has no cervical adenopathy.  Skin: He is not diaphoretic.  Nursing note and vitals reviewed.   Filed Vitals:   05/07/15 0946  BP: 144/88  Pulse: 66  Temp: 98.2 F (36.8 C)  Resp: 18          Assessment & Plan:    1.  Bilateral impacted cerumen -ears irrigated here  2.  Middle ear effusion -prednisone -zyrtec -flonase -3 days of afrin if desired -netti pot rinses

## 2015-07-02 ENCOUNTER — Ambulatory Visit: Payer: BLUE CROSS/BLUE SHIELD | Admitting: Physician Assistant

## 2015-08-12 ENCOUNTER — Ambulatory Visit (INDEPENDENT_AMBULATORY_CARE_PROVIDER_SITE_OTHER): Payer: Self-pay | Admitting: Internal Medicine

## 2015-08-12 VITALS — BP 136/84 | HR 76 | Temp 97.5°F | Resp 16 | Ht 66.75 in | Wt 198.6 lb

## 2015-08-12 DIAGNOSIS — N342 Other urethritis: Secondary | ICD-10-CM

## 2015-08-21 ENCOUNTER — Encounter: Payer: Self-pay | Admitting: Internal Medicine

## 2015-08-21 MED ORDER — DOXYCYCLINE HYCLATE 100 MG PO CAPS: ORAL_CAPSULE | ORAL | Status: DC

## 2015-08-21 NOTE — Progress Notes (Signed)
  Subjective:    Patient ID: Sean Vasquez, male    DOB: [birth year], 54 y.o.   MRN: [id number]  HPI   Patient presents with c/o dysuria & slight clear urethral d/c x 2 weeks- no fever, chills, rash or penile lesions.  Medication Sig  . aspirin 81 MG  Chew 81 mg by mouth daily.  Marland Kitchen. atenolol 100 MG tablet TAKE ONE TABLET BY MOUTH ONCE DAILY  . VITAMIN D Take 2,000 Units by mouth. Takes 8000 units daily  . fish oil  1000 MG  Take 1 g by mouth daily.    Marland Kitchen. FLAXSEED OIL 1000 MG CAPS Take by mouth daily.    Awanda Mink. NORCO 5-325  Take 3 tablets by mouth every 6 (six) hours as needed. For pain  . PROCTOZONE-HC 2.5 % rectal crm Place rectally as needed.  . Magnesium 300 MG CAPS Take by mouth. Takes 3 tablest daily   . Multiple Vitamin Take 1 tablet by mouth daily.    Marland Kitchen. DEPO-TESTOSTERONE 200 MG/ML in 2 cc intramuscularly every 2 weeks OR as directed by your doctor   Allergies  Allergen Reactions  . Crestor [Rosuvastatin]     Elevated CPK  . Lipitor [Atorvastatin]     Myalgias  . Tricor [Fenofibrate]     Myalgias  . Vytorin [Ezetimibe-Simvastatin]     Myalgias  . Zetia [Ezetimibe]    Past Medical History  Diagnosis Date  . Testosterone deficiency   . Hemorrhoids   . Hyperlipidemia   . Hypertension   . Kidney stone   . Vitamin D deficiency   . Allergy    Review of Systems  10 point systems review negative except as above.    Objective:   Physical Exam  BP 136/84 mmHg  Pulse 76  Temp(Src) 97.5 F (36.4 C)  Resp 16  Ht 5' 6.75" (1.695 m)  Wt 198 lb 9.6 oz (90.084 kg)  BMI 31.36 kg/m2  HEENT - Eac's patent. TM's Nl. EOM's full. PERRLA. NasoOroPharynx clear. Neck - supple. Nl Thyroid. Carotids 2+ & No bruits, nodes, JVD Chest - Clear equal BS w/o Rales, rhonchi, wheezes. Cor - Nl HS. RRR w/o sig MGR. PP 1(+). No edema. Abd - Neg. GU - No penile lesions. No discharge evident. DRE - Prostate 12+ tender & boggy MS- FROM w/o deformities. Muscle power, tone and bulk Nl. Gait Nl. Neuro -  No obvious Cr N abnormalities. Sensory, motor and Cerebellar functions appear Nl w/o focal abnormalities. Psyche - Mental status normal & appropriate.  No delusions, ideations or obvious mood abnormalities.    Assessment & Plan:   1. Urethritis  - doxycycline (VIBRAMYCIN) 100 MG capsule; Take 2 capsules at once on a full stomach, then take 1 capsule  2 x day on a full stomach for infection for 2 weeks  Dispense: 30 capsule; Refill: 0  - discussed meds/SE's. ROV - prn

## 2015-09-06 ENCOUNTER — Encounter: Payer: Self-pay | Admitting: Internal Medicine

## 2015-09-06 ENCOUNTER — Ambulatory Visit: Payer: Self-pay | Admitting: Internal Medicine

## 2015-09-06 VITALS — BP 170/98 | HR 68 | Temp 97.9°F | Resp 16 | Ht 66.75 in | Wt 199.3 lb

## 2015-09-06 DIAGNOSIS — R369 Urethral discharge, unspecified: Secondary | ICD-10-CM

## 2015-09-06 MED ORDER — DOXYCYCLINE HYCLATE 100 MG PO CAPS: 100.0000 mg | ORAL_CAPSULE | Freq: Two times a day (BID) | ORAL | Status: DC

## 2015-09-06 MED ORDER — AZITHROMYCIN 500 MG PO TABS
1000.0000 mg | ORAL_TABLET | Freq: Once | ORAL | Status: AC
Start: 2015-09-06 — End: 2015-09-09

## 2015-09-06 NOTE — Progress Notes (Signed)
   Subjective:    Patient ID: Sean Vasquez, male    DOB: 1961/01/22, 54 y.o.   MRN: [id number]  Penile Discharge The patient's primary symptoms include penile discharge. The patient's pertinent negatives include no penile pain, scrotal swelling or testicular pain. Pertinent negatives include no abdominal pain, chills, dysuria, fever, frequency, nausea, urgency or vomiting.   Patient presents to the office for evaluation of a penile discharge that started a month ago.  He reports that he was given a course of doxycycline x 1 week.  He reports that it came back again a week ago. He reports that the discharge is white cloudy fluid that is there only in the morning.  He reports that he has some fluid prior to urinating.  He reports that it causes the meatus to stick together in the morning.  No discharge at all during the daytime.  He reports that he has not had any pain with urinating since finishing the doxycycline.  He reports that he took two tablets of doxycycline and also another 2 tablets later.  He didn't take the medication for 2 weeks.    He reports that he did have unprotected sex with a former partner.     Review of Systems  Constitutional: Negative for fever, chills and fatigue.  Gastrointestinal: Negative for nausea, vomiting and abdominal pain.  Genitourinary: Positive for discharge. Negative for dysuria, urgency, frequency, decreased urine volume, penile swelling, scrotal swelling, genital sores, penile pain and testicular pain.       Objective:   Physical Exam  Constitutional: He is oriented to person, place, and time. He appears well-developed and well-nourished. No distress.  HENT:  Head: Normocephalic.  Mouth/Throat: Oropharynx is clear and moist. No oropharyngeal exudate.  Eyes: Conjunctivae are normal. No scleral icterus.  Neck: Normal range of motion. Neck supple. No JVD present. No thyromegaly present.  Cardiovascular: Normal rate, regular rhythm, normal heart sounds  and intact distal pulses.  Exam reveals no gallop and no friction rub.   No murmur heard. Pulmonary/Chest: Effort normal and breath sounds normal. No respiratory distress. He has no wheezes. He has no rales. He exhibits no tenderness.  Abdominal: Soft. Bowel sounds are normal. He exhibits no distension and no mass. There is no tenderness. There is no rebound and no guarding.  Genitourinary: Prostate normal and penis normal. Right testis shows no mass, no swelling and no tenderness. Right testis is descended. Cremasteric reflex is not absent on the right side. Left testis shows no mass, no swelling and no tenderness. Left testis is descended. Cremasteric reflex is not absent on the left side. Circumcised. No phimosis, paraphimosis, hypospadias, penile erythema or penile tenderness. No discharge found.  Musculoskeletal: Normal range of motion.  Lymphadenopathy:    He has no cervical adenopathy.  Neurological: He is alert and oriented to person, place, and time.  Skin: Skin is warm and dry. He is not diaphoretic.  Nursing note and vitals reviewed.         Assessment & Plan:    1. Penile discharge -sounds as if medications was not properly dispensed.  Will do 1 week of doxycycline BID and 1,000 mg of azithromycin to treat for urethritis.   -if no improvement needs to see urology - Urinalysis, Routine w reflex microscopic ([facility] - Urine culture - HIV antibody - [facility]

## 2015-09-07 ENCOUNTER — Ambulatory Visit: Payer: Self-pay

## 2015-09-07 LAB — HIV ANTIBODY (ROUTINE TESTING W REFLEX): HIV: NONREACTIVE

## 2015-09-07 LAB — URINALYSIS, ROUTINE W REFLEX MICROSCOPIC
Bilirubin Urine: NEGATIVE
Glucose, UA: NEGATIVE
Hgb urine dipstick: NEGATIVE
Ketones, ur: NEGATIVE
LEUKOCYTES UA: NEGATIVE
Nitrite: NEGATIVE
PROTEIN: NEGATIVE
SPECIFIC GRAVITY, URINE: 1.012 (ref 1.001–1.035)
pH: 7 (ref 5.0–8.0)

## 2015-09-07 LAB — URINE CULTURE
COLONY COUNT: NO GROWTH
ORGANISM ID, BACTERIA: NO GROWTH

## 2015-09-07 LAB — RPR

## 2015-09-23 ENCOUNTER — Encounter: Payer: Self-pay | Admitting: Internal Medicine

## 2015-09-23 ENCOUNTER — Ambulatory Visit (INDEPENDENT_AMBULATORY_CARE_PROVIDER_SITE_OTHER): Payer: Self-pay | Admitting: Internal Medicine

## 2015-09-23 VITALS — BP 146/84 | HR 60 | Temp 97.7°F | Resp 16 | Ht 66.75 in | Wt 202.6 lb

## 2015-09-23 DIAGNOSIS — R369 Urethral discharge, unspecified: Secondary | ICD-10-CM

## 2015-09-23 DIAGNOSIS — N342 Other urethritis: Secondary | ICD-10-CM

## 2015-09-23 DIAGNOSIS — Z6832 Body mass index (BMI) 32.0-32.9, adult: Secondary | ICD-10-CM

## 2015-09-23 DIAGNOSIS — I10 Essential (primary) hypertension: Secondary | ICD-10-CM

## 2015-09-25 ENCOUNTER — Encounter: Payer: Self-pay | Admitting: Internal Medicine

## 2015-09-25 DIAGNOSIS — E669 Obesity, unspecified: Secondary | ICD-10-CM | POA: Insufficient documentation

## 2015-09-25 MED ORDER — CIPROFLOXACIN HCL 250 MG PO TABS: 500.0000 mg | ORAL_TABLET | Freq: Two times a day (BID) | ORAL | Status: AC

## 2015-09-25 NOTE — Progress Notes (Signed)
   Subjective:    Patient ID: Sean Vasquez, male    DOB: 1961-09-26, 54 y.o.   MRN: [id number]  HPI   Patient returns from 1 mo f/u for continued c/o urethral d/c and was initially rx'd Doxycycline for 1 month, but apparently was only dispensed 2 capsules and then he returned about 2 weeks ago with continued c/o's of urethral d/c and had negative w/u for std's and was retreated with Zithromax 1000 mg & then Doxycycline x 2 weeks. Now he reports ongoing sx's of awakening with a crusty d/c from the penis upon awakening and no c/o dysuria. He denies any recent intimate sexual encounters.   Patient' s BP is also noted elevated today with patient denying any HT sx's.  Medication Sig  . aspirin 81 MG  Chew 81 mg by mouth daily.  Marland Kitchen. atenolol 100 MG tablet TAKE ONE TABLET BY MOUTH ONCE DAILY  . VITAMIN D Take 2,000 Units by mouth. Takes 8000 units daily  . fish oil-omega3 1000 MG Take 1 g by mouth daily.    Marland Kitchen. FLAXSEED OIL 1000 MG  Take by mouth daily.    Awanda Mink. NORCO 5-325 MG Take 3 tablets by mouth every 6 (six) hours as needed. For pain  . hydrocortisone  2.5 % rectal cream Place rectally as needed.  . Magnesium 300 MG  Take by mouth. Takes 3 tablest daily   . Multiple Vitamin ( Take 1 tablet by mouth daily.    Marland Kitchen. DEPO-TESTOSTERONE 200 MG/ML inj 2 cc intramuscularly every 2 weeks OR as directed by your doctor   PRN Meds:. Allergies  Allergen Reactions  . Crestor [Rosuvastatin]     Elevated CPK  . Lipitor [Atorvastatin]     Myalgias  . Tricor [Fenofibrate]     Myalgias  . Vytorin [Ezetimibe-Simvastatin]     Myalgias  . Zetia [Ezetimibe]    Past Medical History  Diagnosis Date  . Testosterone deficiency   . Hemorrhoids   . Hyperlipidemia   . Hypertension   . Kidney stone   . Vitamin D deficiency   . Allergy    Review of Systems  10 point systems review negative except as above.    Objective:   Physical Exam  BP 146/84 mmHg  Pulse 60  Temp(Src) 97.7 F (36.5 C)  Resp 16  Ht 5'  6.75" (1.695 m)  Wt 202 lb 9.6 oz (91.899 kg)  BMI 31.99 kg/m2  Gu focused exam finds no external rash or lesions and urethral orifice appears unremarkable. Scrotal contents  are likewise unremarkable. DRE finds a Nl prostate. No discharge is expressed.     Assessment & Plan:   1. Penile discharge  - patient will be retreated suspect for an atypical infection as chlamydia or mycoplasma   - ciprofloxacin (CIPRO) 250 MG tablet; Take 2 tablets (500 mg total) by mouth 2 (two) times daily.  Dispense: 30 tablet; Refill: 0  - discussed with patient that this may just represent Nocturnal Emissions.   - discussed meds/SE's.   2. HTN  - also patient is counseled to monitor his BP's closely

## 2015-09-25 NOTE — Patient Instructions (Signed)
Urethritis, Adult °Urethritis is an inflammation of the tube through which urine exits your bladder (urethra).  °CAUSES °Urethritis is often caused by an infection in your urethra. The infection can be viral, like herpes. The infection can also be bacterial, like gonorrhea. °RISK FACTORS °Risk factors of urethritis include: °· Having sex without using a condom. °· Having multiple sexual partners. °· Having poor hygiene. °SIGNS AND SYMPTOMS °Symptoms of urethritis are less noticeable in women than in men. These symptoms include: °· Burning feeling when you urinate (dysuria). °· Discharge from your urethra. °· Blood in your urine (hematuria). °· Urinating more than usual. °DIAGNOSIS  °To confirm a diagnosis of urethritis, your health care provider will do the following: °· Ask about your sexual history. °· Perform a physical exam. °· Have you provide a sample of your urine for lab testing. °· Use a cotton swab to gently collect a sample from your urethra for lab testing. °TREATMENT  °It is important to treat urethritis. Depending on the cause, untreated urethritis may lead to serious genital infections and possibly infertility. Urethritis caused by a bacterial infection is treated with antibiotic medicine. All sexual partners must be treated.  °HOME CARE INSTRUCTIONS °· Do not have sex until the test results are known and treatment is completed, even if your symptoms go away before you finish treatment. °· If you were prescribed an antibiotic, finish it all even if you start to feel better. °SEEK MEDICAL CARE IF:  °· Your symptoms are not improved in 3 days. °· Your symptoms are getting worse. °· You develop abdominal pain or pelvic pain (in women). °· You develop joint pain. °· You have a fever. °SEEK IMMEDIATE MEDICAL CARE IF:  °· You have severe pain in the belly, back, or side. °· You have repeated vomiting. °MAKE SURE YOU: °· Understand these instructions. °· Will watch your condition. °· Will get help right away  if you are not doing well or get worse. °  °This information is not intended to replace advice given to you by your health care provider. Make sure you discuss any questions you have with your health care provider. °  °Document Released: 03/28/2001 Document Revised: 02/16/2015 Document Reviewed: 06/02/2013 °Elsevier Interactive Patient Education ©2016 [employer] ° °

## 2015-10-30 ENCOUNTER — Other Ambulatory Visit: Payer: Self-pay | Admitting: Physician Assistant

## 2015-10-30 ENCOUNTER — Other Ambulatory Visit: Payer: Self-pay | Admitting: Internal Medicine

## 2016-04-22 ENCOUNTER — Other Ambulatory Visit: Payer: Self-pay | Admitting: Internal Medicine

## 2016-04-22 DIAGNOSIS — I10 Essential (primary) hypertension: Secondary | ICD-10-CM

## 2016-05-15 ENCOUNTER — Encounter: Payer: Self-pay | Admitting: Internal Medicine

## 2016-05-15 ENCOUNTER — Ambulatory Visit (INDEPENDENT_AMBULATORY_CARE_PROVIDER_SITE_OTHER): Payer: Self-pay | Admitting: Internal Medicine

## 2016-05-15 VITALS — BP 146/92 | HR 64 | Temp 97.7°F | Resp 16 | Ht 66.0 in | Wt 193.8 lb

## 2016-05-15 DIAGNOSIS — E291 Testicular hypofunction: Secondary | ICD-10-CM

## 2016-05-15 DIAGNOSIS — E785 Hyperlipidemia, unspecified: Secondary | ICD-10-CM

## 2016-05-15 DIAGNOSIS — E349 Endocrine disorder, unspecified: Secondary | ICD-10-CM

## 2016-05-15 DIAGNOSIS — E559 Vitamin D deficiency, unspecified: Secondary | ICD-10-CM

## 2016-05-15 DIAGNOSIS — I10 Essential (primary) hypertension: Secondary | ICD-10-CM

## 2016-05-15 DIAGNOSIS — Z79899 Other long term (current) drug therapy: Secondary | ICD-10-CM

## 2016-05-15 DIAGNOSIS — R7303 Prediabetes: Secondary | ICD-10-CM

## 2016-05-15 MED ORDER — BISOPROLOL-HYDROCHLOROTHIAZIDE 10-6.25 MG PO TABS: ORAL_TABLET | ORAL | 1 refills | Status: DC

## 2016-05-15 MED ORDER — LISINOPRIL 40 MG PO TABS: ORAL_TABLET | ORAL | 1 refills | Status: DC

## 2016-05-15 NOTE — Patient Instructions (Signed)

## 2016-05-15 NOTE — Progress Notes (Signed)
[facility]                       Lucky Cowboy, M.D.        Dyanne Carrel. Steffanie Dunn, P.A.-C       Terri Piedra, P.A.-C   [facility]                [address]                [place], [place]. [address] Telephone [phone] Telefax [phone] ______________________________________________________________________     This very nice 55 y.o. single WM presents for follow up with Hypertension, Hyperlipidemia, Pre-Diabetes, Testosterone  and Vitamin D Deficiency.      Patient is treated for HTN & BP has been elevated at home. Today's BP was elevated at 146/92 by the nurse and was 166/106 x 2 by myself. Patient has had no complaints of any cardiac type chest pain, palpitations, dyspnea/orthopnea/PND, dizziness, claudication, or dependent edema.     Hyperlipidemia is not controlled with diet and patient was strongly encouraged to practice a more prudent low cholesterol diet.  Last Lipids were not at goal: Lab Results  Component Value Date   CHOL 246 (H) 03/30/2015   HDL 29 (L) 03/30/2015   LDLCALC 147 (H) 03/30/2015   TRIG 349 (H) 03/30/2015   CHOLHDL 8.5 03/30/2015      Also, the patient has history of PreDiabetes and has had no symptoms of reactive hypoglycemia, diabetic polys, paresthesias or visual blurring.  Last A1c was  Lab Results  Component Value Date   HGBA1C 5.3 03/30/2015      Further, the patient also has history of Vitamin D Deficiency and supplements vitamin D without any suspected side-effects. Last vitamin D was   Lab Results  Component Value Date   VD25OH 6 03/30/2015   Current Outpatient Prescriptions on File Prior to Visit  Medication Sig  . aspirin 81 MG chewable tablet Chew 81 mg by mouth daily.  . Cholecalciferol (VITAMIN D PO) Take 2,000 Units by mouth. Takes 8000 units daily  . hydrocortisone (PROCTOZONE-HC) 2.5 % rectal cream Place rectally as needed.  . Magnesium 300 MG CAPS Take  by mouth. Takes 3 tablest daily   . Multiple Vitamin (MULTIVITAMIN) tablet Take 1 tablet by mouth daily.    Marland Kitchen NEEDLE, DISP, 21 G 21G X 1" MISC Use for testosterone injections, 3 ml  . testosterone cypionate (DEPOTESTOSTERONE CYPIONATE) 200 MG/ML injection INJECT 2 CC, INTRAMUSCULARLY EVERY 2 WEEKS OR AS DIRECTED BY YOUR DOCTOR  . fish oil-omega-3 fatty acids 1000 MG capsule Take 1 g by mouth daily.    . Flaxseed, Linseed, (FLAXSEED OIL) 1000 MG CAPS Take by mouth daily.     No current facility-administered medications on file prior to visit.    Allergies  Allergen Reactions  . Crestor [Rosuvastatin]     Elevated CPK  . Lipitor [Atorvastatin]     Myalgias  . Tricor [Fenofibrate]     Myalgias  . Vytorin [Ezetimibe-Simvastatin]     Myalgias  . Zetia [Ezetimibe]    PMHx:   Past Medical History:  Diagnosis Date  . Allergy   . Hemorrhoids   . Hyperlipidemia   . Hypertension   . Kidney stone   . Testosterone deficiency   . Vitamin D deficiency    Immunization History  Administered Date(s) Administered  . DT 10/19/2003  . Influenza Split 07/10/2013  . Pneumococcal Polysaccharide-23 03/03/2009  Past Surgical History:  Procedure Laterality Date  . BASAL CELL CARCINOMA EXCISION     rt. eyelid  . COLONOSCOPY  12/12/2011   Procedure: COLONOSCOPY;  Surgeon: Iva Boop, MD;  Location: [facility];  Service: Endoscopy;  Laterality: N/A;  with possible hemorrhoid banding  . WISDOM TOOTH EXTRACTION  1999   FHx:    Reviewed / unchanged  SHx:    Reviewed / unchanged  Systems Review:  Constitutional: Denies fever, chills, wt changes, headaches, insomnia, fatigue, night sweats, change in appetite. Eyes: Denies redness, blurred vision, diplopia, discharge, itchy, watery eyes.  ENT: Denies discharge, congestion, post nasal drip, epistaxis, sore throat, earache, hearing loss, dental pain, tinnitus, vertigo, sinus pain, snoring.  CV: Denies chest pain, palpitations, irregular  heartbeat, syncope, dyspnea, diaphoresis, orthopnea, PND, claudication or edema. Respiratory: denies cough, dyspnea, DOE, pleurisy, hoarseness, laryngitis, wheezing.  Gastrointestinal: Denies dysphagia, odynophagia, heartburn, reflux, water brash, abdominal pain or cramps, nausea, vomiting, bloating, diarrhea, constipation, hematemesis, melena, hematochezia  or hemorrhoids. Genitourinary: Denies dysuria, frequency, urgency, nocturia, hesitancy, discharge, hematuria or flank pain. Musculoskeletal: Denies arthralgias, myalgias, stiffness, jt. swelling, pain, limping or strain/sprain.  Skin: Denies pruritus, rash, hives, warts, acne, eczema or change in skin lesion(s). Neuro: No weakness, tremor, incoordination, spasms, paresthesia or pain. Psychiatric: Denies confusion, memory loss or sensory loss. Endo: Denies change in weight, skin or hair change.  Heme/Lymph: No excessive bleeding, bruising or enlarged lymph nodes.  Physical Exam  BP (!) 146/92   Pulse 64   Temp 97.7 F (36.5 C)   Resp 16   Ht  (1.676 m)   Wt 193 lb 12.8 oz (87.9 kg)   BMI 31.28 kg/m  ReCheck BP 166/106 x 2  At wall & by wrist cuff.   Appears well nourished and in no distress. Eyes: PERRLA, EOMs, conjunctiva no swelling or erythema. Sinuses: No frontal/maxillary tenderness ENT/Mouth: EAC's clear, TM's nl w/o erythema, bulging. Nares clear w/o erythema, swelling, exudates. Oropharynx clear without erythema or exudates. Oral hygiene is good. Tongue normal, non obstructing. Hearing intact.  Neck: Supple. Thyroid nl. Car 2+/2+ without bruits, nodes or JVD. Chest: Respirations nl with BS clear & equal w/o rales, rhonchi, wheezing or stridor.  Cor: Heart sounds normal w/ regular rate and rhythm without sig. murmurs, gallops, clicks, or rubs. Peripheral pulses normal and equal  without edema.  Abdomen: Soft & bowel sounds normal. Non-tender w/o guarding, rebound, hernias, masses, or organomegaly.  Lymphatics:  Unremarkable.  Musculoskeletal: Full ROM all peripheral extremities, joint stability, 5/5 strength, and normal gait.  Skin: Warm, dry without exposed rashes, lesions or ecchymosis apparent.  Neuro: Cranial nerves intact, reflexes equal bilaterally. Sensory-motor testing grossly intact. Tendon reflexes grossly intact.  Pysch: Alert & oriented x 3.  Insight and judgement nl & appropriate. No ideations.  Assessment and Plan:  1. Essential hypertension  - Continue medication, monitor blood pressure at home. Continue DASH diet. Reminder to go to the ER if any CP, SOB, nausea, dizziness, severe HA, changes vision/speech, left arm numbness and tingling and jaw pain.  - bisoprolol-hydrochlorothiazide (ZIAC) 10-6.25 MG tablet; Take 1 tablet every morning for BP  Dispense: 90 tablet; Refill: 1 - lisinopril (PRINIVIL,ZESTRIL) 40 MG tablet; Take 1/2 to 1 tablet at nite for BP  Dispense: 90 tablet; Refill: 1  - encouraged frequent - at least daily - BP monitoring and to call if remains elevated.  2. Hyperlipidemia  - Continue diet/meds, exercise,& lifestyle modifications. Continue monitor periodic cholesterol/liver & renal functions  3. Prediabetes  - Continue diet, exercise, lifestyle modifications. Monitor appropriate labs.  4. Vitamin D deficiency  - Continue supplementation.  5. Testosterone deficiency  - continue supplements  6. Medication management  - continue meds as above.    Recommended regular exercise, BP monitoring, weight control, and discussed med and SE's. Patient declined labs due to uninsured status. Over 20 minutes of exam, counseling, chart review was performed

## 2016-08-13 ENCOUNTER — Other Ambulatory Visit: Payer: Self-pay | Admitting: Internal Medicine

## 2016-09-11 ENCOUNTER — Ambulatory Visit (INDEPENDENT_AMBULATORY_CARE_PROVIDER_SITE_OTHER): Payer: Self-pay | Admitting: Internal Medicine

## 2016-09-11 ENCOUNTER — Encounter: Payer: Self-pay | Admitting: Internal Medicine

## 2016-09-11 VITALS — BP 136/84 | HR 64 | Temp 97.2°F | Resp 16 | Ht 66.0 in | Wt 195.4 lb

## 2016-09-11 DIAGNOSIS — E349 Endocrine disorder, unspecified: Secondary | ICD-10-CM

## 2016-09-11 DIAGNOSIS — R7303 Prediabetes: Secondary | ICD-10-CM

## 2016-09-11 DIAGNOSIS — I10 Essential (primary) hypertension: Secondary | ICD-10-CM

## 2016-09-11 DIAGNOSIS — Z79899 Other long term (current) drug therapy: Secondary | ICD-10-CM

## 2016-09-11 DIAGNOSIS — E782 Mixed hyperlipidemia: Secondary | ICD-10-CM

## 2016-09-11 DIAGNOSIS — E559 Vitamin D deficiency, unspecified: Secondary | ICD-10-CM

## 2016-09-11 LAB — LIPID PANEL
Cholesterol: 229 mg/dL — ABNORMAL HIGH (ref <200)
HDL: 30 mg/dL — ABNORMAL LOW (ref >40)
LDL CALC: 123 mg/dL — AB (ref <100)
TRIGLYCERIDES: 379 mg/dL — AB (ref <150)
Total CHOL/HDL Ratio: 7.6 Ratio — ABNORMAL HIGH (ref <5.0)
VLDL: 76 mg/dL — ABNORMAL HIGH (ref <30)

## 2016-09-11 LAB — BASIC METABOLIC PANEL WITH GFR
BUN: 19 mg/dL (ref 7–25)
CALCIUM: 9.8 mg/dL (ref 8.6–10.3)
CO2: 28 mmol/L (ref 20–31)
CREATININE: 1.24 mg/dL (ref 0.70–1.33)
Chloride: 102 mmol/L (ref 98–110)
GFR, EST AFRICAN AMERICAN: 76 mL/min (ref >=60)
GFR, Est Non African American: 66 mL/min (ref >=60)
Glucose, Bld: 109 mg/dL — ABNORMAL HIGH (ref 65–99)
Potassium: 4.7 mmol/L (ref 3.5–5.3)
SODIUM: 138 mmol/L (ref 135–146)

## 2016-09-11 NOTE — Progress Notes (Signed)
[facility] Sean Vasquez, M.D.        Sean Vasquez, P.A.-C       Sean Vasquez, P.A.-C  Sean Vasquez                [address]                [place], [place] [id number] Telephone [phone] Telefax [phone] ______________________________________________________________________     This very nice 55 y.o. SWM presents for follow up with Hypertension, Hyperlipidemia, Pre-Diabetes, Testosterone  and Vitamin D Deficiency.      Patient is treated for labile HTN circa 2005  & BP has been controlled at home. Today's BP is  136/84. Patient has had no complaints of any cardiac type chest pain, palpitations, dyspnea/orthopnea/PND, dizziness, claudication, or dependent edema.     Hyperlipidemia is not controlled with diet & patient has hx/o Intolerance /Allergies to Statins/Zetia and fibrates.  Last Lipids were not at goal: Lab Results  Component Value Date   CHOL 246 (H) 03/30/2015   HDL 29 (L) 03/30/2015   LDLCALC 147 (H) 03/30/2015   TRIG 349 (H) 03/30/2015   CHOLHDL 8.5 03/30/2015      Also, the patient has history of Insulin resistance with A1c 5.0% and sl elevated Insulin 36 in June 2015. He denies symptoms of reactive hypoglycemia, diabetic polys, paresthesias or visual blurring.  Last A1c was at goal: Lab Results  Component Value Date   HGBA1C 5.3 03/30/2015      Further, the patient also has history of Vitamin D Deficiency in 2008 of "23"  and supplements vitamin D without any suspected side-effects. Last vitamin D was at goal: Lab Results  Component Value Date   VD25OH 71 03/30/2015   Current Outpatient Prescriptions on File Prior to Visit  Medication Sig  . aspirin 81 MG chewable tablet Chew 81 mg by mouth daily.  . bisoprolol-hydrochlorothiazide (ZIAC) 10-6.25 MG tablet Take 1 tablet every morning for BP  . Cholecalciferol (VITAMIN D PO) Take 2,000 Units by mouth. Takes 8000 units daily  .  fish oil-omega-3 fatty acids 1000 MG capsule Take 1 g by mouth daily.    . Flaxseed, Linseed, (FLAXSEED OIL) 1000 MG CAPS Take by mouth daily.    . hydrocortisone (PROCTOZONE-HC) 2.5 % rectal cream Place rectally as needed.  Marland Kitchen. lisinopril (PRINIVIL,ZESTRIL) 40 MG tablet Take 1/2 to 1 tablet at nite for BP  . Multiple Vitamin (MULTIVITAMIN) tablet Take 1 tablet by mouth daily.    Marland Kitchen. NEEDLE, DISP, 21 G 21G X 1" MISC Use for testosterone injections, 3 ml  . testosterone cypionate (DEPOTESTOSTERONE CYPIONATE) 200 MG/ML injection INJECT TWO ML  EVERY 2 WEEKS OR AS DIRECTED BY MD   No current facility-administered medications on file prior to visit.    Allergies  Allergen Reactions  . Crestor [Rosuvastatin]     Elevated CPK  . Lipitor [Atorvastatin]     Myalgias  . Tricor [Fenofibrate]     Myalgias  . Vytorin [Ezetimibe-Simvastatin]     Myalgias  . Zetia [Ezetimibe]    PMHx:   Past Medical History:  Diagnosis Date  . Allergy   . Hemorrhoids   . Hyperlipidemia   . Hypertension   . Kidney stone   . Testosterone deficiency   . Vitamin D deficiency    Immunization History  Administered Date(s) Administered  . DT 10/19/2003  . Influenza Split 07/10/2013  . Pneumococcal Polysaccharide-23 03/03/2009   Past  Surgical History:  Procedure Laterality Date  . BASAL CELL CARCINOMA EXCISION     rt. eyelid  . COLONOSCOPY  12/12/2011   Procedure: COLONOSCOPY;  Surgeon: Iva Boop, MD;  Location: [facility];  Service: Endoscopy;  Laterality: N/A;  with possible hemorrhoid banding  . WISDOM TOOTH EXTRACTION  1999   FHx:    Reviewed / unchanged  SHx:    Reviewed / unchanged  Systems Review:  Constitutional: Denies fever, chills, wt changes, headaches, insomnia, fatigue, night sweats, change in appetite. Eyes: Denies redness, blurred vision, diplopia, discharge, itchy, watery eyes.  ENT: Denies discharge, congestion, post nasal drip, epistaxis, sore throat, earache, hearing loss, dental  pain, tinnitus, vertigo, sinus pain, snoring.  CV: Denies chest pain, palpitations, irregular heartbeat, syncope, dyspnea, diaphoresis, orthopnea, PND, claudication or edema. Respiratory: denies cough, dyspnea, DOE, pleurisy, hoarseness, laryngitis, wheezing.  Gastrointestinal: Denies dysphagia, odynophagia, heartburn, reflux, water brash, abdominal pain or cramps, nausea, vomiting, bloating, diarrhea, constipation, hematemesis, melena, hematochezia  or hemorrhoids. Genitourinary: Denies dysuria, frequency, urgency, nocturia, hesitancy, discharge, hematuria or flank pain. Musculoskeletal: Denies arthralgias, myalgias, stiffness, jt. swelling, pain, limping or strain/sprain.  Skin: Denies pruritus, rash, hives, warts, acne, eczema or change in skin lesion(s). Neuro: No weakness, tremor, incoordination, spasms, paresthesia or pain. Psychiatric: Denies confusion, memory loss or sensory loss. Endo: Denies change in weight, skin or hair change.  Heme/Lymph: No excessive bleeding, bruising or enlarged lymph nodes.  Physical Exam BP 136/84   Pulse 64   Temp 97.2 F (36.2 C)   Resp 16   Ht 5\' 6"  (1.676 m)   Wt 195 lb 6.4 oz (88.6 kg)   BMI 31.54 kg/m   Appears well nourished and in no distress.  Eyes: PERRLA, EOMs, conjunctiva no swelling or erythema. Sinuses: No frontal/maxillary tenderness ENT/Mouth: EAC's clear, TM's nl w/o erythema, bulging. Nares clear w/o erythema, swelling, exudates. Oropharynx clear without erythema or exudates. Oral hygiene is good. Tongue normal, non obstructing. Hearing intact.  Neck: Supple. Thyroid nl. Car 2+/2+ without bruits, nodes or JVD. Chest: Respirations nl with BS clear & equal w/o rales, rhonchi, wheezing or stridor.  Cor: Heart sounds normal w/ regular rate and rhythm without sig. murmurs, gallops, clicks, or rubs. Peripheral pulses normal and equal  without edema.  Abdomen: Soft & bowel sounds normal. Non-tender w/o guarding, rebound, hernias, masses,  or organomegaly.  Lymphatics: Unremarkable.  Musculoskeletal: Full ROM all peripheral extremities, joint stability, 5/5 strength, and normal gait.  Skin: Warm, dry without exposed rashes, lesions or ecchymosis apparent.  Neuro: Cranial nerves intact, reflexes equal bilaterally. Sensory-motor testing grossly intact. Tendon reflexes grossly intact.  Pysch: Alert & oriented x 3.  Insight and judgement nl & appropriate. No ideations.  Assessment and Plan:   1. Essential hypertension  - Continue medication, monitor blood pressure at home.  - Continue DASH diet. Reminder to go to the ER if any CP,  SOB, nausea, dizziness, severe HA, changes vision/speech,  left arm numbness and tingling and jaw pain.  2. Mixed hyperlipidemia  - Continue diet/meds, exercise,& lifestyle modifications.  - Continue monitor periodic cholesterol/liver & renal functions  - Lipid panel  3. Prediabetes  - Continue diet, exercise, lifestyle modifications.   4. Vitamin D deficiency  - Continue supplementation.  5. Testosterone deficiency   6. Medication management  - BASIC METABOLIC PANEL WITH GFR        Recommended regular exercise, BP monitoring, weight control, and discussed med and SE's. Patient deferred full recommended lab testing due to  lack of insurance coverage at present.  Further disposition pending results of labs. Over 30 minutes of exam, counseling, chart review was performed

## 2016-09-11 NOTE — Patient Instructions (Signed)

## 2016-10-17 ENCOUNTER — Other Ambulatory Visit: Payer: Self-pay | Admitting: *Deleted

## 2016-10-17 ENCOUNTER — Other Ambulatory Visit: Payer: Self-pay | Admitting: Internal Medicine

## 2016-10-17 DIAGNOSIS — I10 Essential (primary) hypertension: Secondary | ICD-10-CM

## 2016-10-17 MED ORDER — BISOPROLOL-HYDROCHLOROTHIAZIDE 10-6.25 MG PO TABS: ORAL_TABLET | ORAL | 1 refills | Status: DC

## 2016-12-11 ENCOUNTER — Encounter: Payer: Self-pay | Admitting: Internal Medicine

## 2017-01-01 ENCOUNTER — Encounter: Payer: Self-pay | Admitting: Internal Medicine

## 2017-01-09 ENCOUNTER — Other Ambulatory Visit: Payer: Self-pay | Admitting: Internal Medicine

## 2017-01-09 DIAGNOSIS — I10 Essential (primary) hypertension: Secondary | ICD-10-CM

## 2017-01-17 ENCOUNTER — Other Ambulatory Visit: Payer: Self-pay | Admitting: *Deleted

## 2017-01-17 DIAGNOSIS — I10 Essential (primary) hypertension: Secondary | ICD-10-CM

## 2017-01-17 MED ORDER — LISINOPRIL 40 MG PO TABS: ORAL_TABLET | ORAL | 1 refills | Status: DC

## 2017-02-08 ENCOUNTER — Ambulatory Visit (INDEPENDENT_AMBULATORY_CARE_PROVIDER_SITE_OTHER): Payer: Self-pay | Admitting: Internal Medicine

## 2017-02-08 ENCOUNTER — Encounter: Payer: Self-pay | Admitting: Internal Medicine

## 2017-02-08 VITALS — BP 130/86 | HR 64 | Temp 97.0°F | Resp 16 | Ht 66.0 in | Wt 201.8 lb

## 2017-02-08 DIAGNOSIS — Z125 Encounter for screening for malignant neoplasm of prostate: Secondary | ICD-10-CM

## 2017-02-08 DIAGNOSIS — I10 Essential (primary) hypertension: Secondary | ICD-10-CM

## 2017-02-08 DIAGNOSIS — E782 Mixed hyperlipidemia: Secondary | ICD-10-CM

## 2017-02-08 LAB — BASIC METABOLIC PANEL WITH GFR
BUN: 20 mg/dL (ref 7–25)
CO2: 27 mmol/L (ref 20–31)
Calcium: 10 mg/dL (ref 8.6–10.3)
Chloride: 101 mmol/L (ref 98–110)
Creat: 1.3 mg/dL (ref 0.70–1.33)
GFR, EST AFRICAN AMERICAN: 72 mL/min (ref >=60)
GFR, EST NON AFRICAN AMERICAN: 62 mL/min (ref >=60)
Glucose, Bld: 78 mg/dL (ref 65–99)
POTASSIUM: 4.8 mmol/L (ref 3.5–5.3)
SODIUM: 138 mmol/L (ref 135–146)

## 2017-02-08 LAB — LIPID PANEL
CHOL/HDL RATIO: 7.7 ratio — AB (ref <5.0)
CHOLESTEROL: 224 mg/dL — AB (ref <200)
HDL: 29 mg/dL — AB (ref >40)
LDL Cholesterol: 144 mg/dL — ABNORMAL HIGH (ref <100)
TRIGLYCERIDES: 255 mg/dL — AB (ref <150)
VLDL: 51 mg/dL — ABNORMAL HIGH (ref <30)

## 2017-02-08 NOTE — Progress Notes (Signed)
This very nice 56 y.o. single WM presents for  follow up with Hypertension, Hyperlipidemia, Pre-Diabetes and Vitamin D Deficiency.      Patient is treated for labile HTN (2005)  & BP has been controlled at home. Today's BP is at goal - 130/86. Patient has had no complaints of any cardiac type chest pain, palpitations, dyspnea/orthopnea/PND, dizziness, claudication, or dependent edema.     Hyperlipidemia is not controlled with diet with hx/o intolerance to Statins,  fibrates and ezetimibe.  Patient denies myalgias or other med SE's. Last Lipids were  Lab Results  Component Value Date   CHOL 229 (H) 09/11/2016   HDL 30 (L) 09/11/2016   LDLCALC 123 (H) 09/11/2016   TRIG 379 (H) 09/11/2016   CHOLHDL 7.6 (H) 09/11/2016      Also, the patient has history of PreDiabetes/Insulin Resistance with A1c 5.0% and elevated Insulin of 36 in 2015.  He denies symptoms of reactive hypoglycemia, diabetic polys, paresthesias or visual blurring.  Last A1c was at goal: Lab Results  Component Value Date   HGBA1C 5.3 03/30/2015      Patient has hx/o Low T & is on Testosterone injections. Further, the patient also has history of Vitamin D Deficiency ("23" in 2008) and supplements vitamin D without any suspected side-effects. Last vitamin D was at goal:  Lab Results  Component Value Date   VD25OH 71 03/30/2015   Current Outpatient Prescriptions on File Prior to Visit  Medication Sig  . aspirin 81 MG chewable tablet Chew 81 mg by mouth daily.  . bisoprolol-hydrochlorothiazide (ZIAC) 10-6.25 MG tablet TAKE ONE TABLET BY MOUTH ONCE DAILY IN THE MORNING FOR BLOOD PRESSURE  . Cholecalciferol (VITAMIN D PO) Take 2,000 Units by mouth. Takes 8000 units daily  . fish oil-omega-3 fatty acids 1000 MG capsule Take 1 g by mouth daily.    . Flaxseed, Linseed, (FLAXSEED OIL) 1000 MG CAPS Take by mouth daily.    . hydrocortisone (PROCTOZONE-HC) 2.5 % rectal cream Place rectally as needed.  Marland Kitchen lisinopril (PRINIVIL,ZESTRIL)  40 MG tablet TAKE ONE-HALF TO ONE TABLET BY MOUTH AT NIGHT FOR BLOOD PRESSURE  . Magnesium 500 MG TABS Take 1 tablet by mouth daily.  . Multiple Vitamin (MULTIVITAMIN) tablet Take 1 tablet by mouth daily.    Marland Kitchen NEEDLE, DISP, 21 G 21G X 1" MISC Use for testosterone injections, 3 ml  . testosterone cypionate (DEPOTESTOSTERONE CYPIONATE) 200 MG/ML injection INJECT TWO ML  EVERY 2 WEEKS OR AS DIRECTED BY MD   No current facility-administered medications on file prior to visit.    Allergies  Allergen Reactions  . Crestor [Rosuvastatin]     Elevated CPK  . Lipitor [Atorvastatin]     Myalgias  . Tricor [Fenofibrate]     Myalgias  . Vytorin [Ezetimibe-Simvastatin]     Myalgias  . Zetia [Ezetimibe]    PMHx:   Past Medical History:  Diagnosis Date  . Allergy   . Hemorrhoids   . Hyperlipidemia   . Hypertension   . Kidney stone   . Testosterone deficiency   . Vitamin D deficiency    Immunization History  Administered Date(s) Administered  . DT 10/19/2003  . Influenza Split 07/10/2013  . Pneumococcal Polysaccharide-23 03/03/2009   Past Surgical History:  Procedure Laterality Date  . BASAL CELL CARCINOMA EXCISION     rt. eyelid  . COLONOSCOPY  12/12/2011   Procedure: COLONOSCOPY;  Surgeon: Iva Boop, MD;  Location: [facility];  Service: Endoscopy;  Laterality: N/A;  with possible hemorrhoid banding  . WISDOM TOOTH EXTRACTION  1999   FHx:    Reviewed / unchanged  SHx:    Reviewed / unchanged  Systems Review:  Constitutional: Denies fever, chills, wt changes, headaches, insomnia, fatigue, night sweats, change in appetite. Eyes: Denies redness, blurred vision, diplopia, discharge, itchy, watery eyes.  ENT: Denies discharge, congestion, post nasal drip, epistaxis, sore throat, earache, hearing loss, dental pain, tinnitus, vertigo, sinus pain, snoring.  CV: Denies chest pain, palpitations, irregular heartbeat, syncope, dyspnea, diaphoresis, orthopnea, PND, claudication or  edema. Respiratory: denies cough, dyspnea, DOE, pleurisy, hoarseness, laryngitis, wheezing.  Gastrointestinal: Denies dysphagia, odynophagia, heartburn, reflux, water brash, abdominal pain or cramps, nausea, vomiting, bloating, diarrhea, constipation, hematemesis, melena, hematochezia  or hemorrhoids. Genitourinary: Denies dysuria, frequency, urgency, nocturia, hesitancy, discharge, hematuria or flank pain. Musculoskeletal: Denies arthralgias, myalgias, stiffness, jt. swelling, pain, limping or strain/sprain.  Skin: Denies pruritus, rash, hives, warts, acne, eczema or change in skin lesion(s). Neuro: No weakness, tremor, incoordination, spasms, paresthesia or pain. Psychiatric: Denies confusion, memory loss or sensory loss. Endo: Denies change in weight, skin or hair change.  Heme/Lymph: No excessive bleeding, bruising or enlarged lymph nodes.  Physical Exam  BP 130/86   Pulse 64   Temp 97 F (36.1 C)   Resp 16   Ht  (1.676 m)   Wt 201 lb 12.8 oz (91.5 kg)   BMI 32.57 kg/m   Appears well nourished, well groomed  and in no distress.  Eyes: PERRLA, EOMs, conjunctiva no swelling or erythema. Sinuses: No frontal/maxillary tenderness ENT/Mouth: EAC's clear, TM's nl w/o erythema, bulging. Nares clear w/o erythema, swelling, exudates. Oropharynx clear without erythema or exudates. Oral hygiene is good. Tongue normal, non obstructing. Hearing intact.  Neck: Supple. Thyroid nl. Car 2+/2+ without bruits, nodes or JVD. Chest: Respirations nl with BS clear & equal w/o rales, rhonchi, wheezing or stridor.  Cor: Heart sounds normal w/ regular rate and rhythm without sig. murmurs, gallops, clicks or rubs. Peripheral pulses normal and equal  without edema.  Abdomen: Soft & bowel sounds normal. Non-tender w/o guarding, rebound, hernias, masses or organomegaly.  Lymphatics: Unremarkable.  Musculoskeletal: Full ROM all peripheral extremities, joint stability, 5/5 strength and normal gait.  Skin:  Warm, dry without exposed rashes, lesions or ecchymosis apparent.  Neuro: Cranial nerves intact, reflexes equal bilaterally. Sensory-motor testing grossly intact. Tendon reflexes grossly intact.  Pysch: Alert & oriented x 3.  Insight and judgement nl & appropriate. No ideations.  Assessment and Plan:  1. Essential hypertension  - Continue medication, monitor blood pressure at home.  - Continue DASH diet. Reminder to go to the ER if any CP,  SOB, nausea, dizziness, severe HA, changes vision/speech,  left arm numbness and tingling and jaw pain.  - BASIC METABOLIC PANEL WITH GFR  2. Mixed hyperlipidemia  - Continue diet/meds, exercise,& lifestyle modifications.  - Continue monitor periodic cholesterol/liver & renal functions   - Lipid panel  3. Prostate cancer screening - PSA       Discussed  regular exercise, BP monitoring, weight control to achieve/maintain BMI less than 25 and discussed med and SE's. Recommended labs to assess and monitor clinical status with further disposition pending results of labs. Over 30 minutes of exam, counseling, chart review was performed.

## 2017-02-08 NOTE — Patient Instructions (Signed)

## 2017-02-09 LAB — PSA: PSA: 0.6 ng/mL (ref <=4.0)

## 2017-03-19 ENCOUNTER — Ambulatory Visit (INDEPENDENT_AMBULATORY_CARE_PROVIDER_SITE_OTHER): Payer: Self-pay | Admitting: Internal Medicine

## 2017-03-19 ENCOUNTER — Encounter: Payer: Self-pay | Admitting: Internal Medicine

## 2017-03-19 VITALS — BP 142/84 | HR 60 | Temp 97.7°F | Resp 16 | Ht 66.0 in | Wt 201.1 lb

## 2017-03-19 DIAGNOSIS — J0140 Acute pansinusitis, unspecified: Secondary | ICD-10-CM

## 2017-03-19 DIAGNOSIS — J0410 Acute tracheitis without obstruction: Secondary | ICD-10-CM

## 2017-03-19 MED ORDER — LEVOFLOXACIN 500 MG PO TABS: ORAL_TABLET | ORAL | 1 refills | Status: AC

## 2017-03-19 MED ORDER — PREDNISONE 20 MG PO TABS: ORAL_TABLET | ORAL | 0 refills | Status: DC

## 2017-03-19 NOTE — Progress Notes (Signed)
  Subjective:    Patient ID: Sean Vasquez, male    DOB: 08-Aug-1961, 56 y.o.   MRN: [id number]  HPI   This nice 56 yo SWM with HTN, HLD, preDM, Vit D Def presents with a progressively worsening 2 week prodrome of head & chest congestion with putrid thick yellow green sputum. Denies fever, chills, sweats, rash or dyspnea.    Medication Sig  . aspirin 81 MG chewable tablet Chew 81 mg by mouth daily.  . bisoprolol-hydrochlorothiazide (ZIAC) 10-6.25 MG tablet TAKE ONE TABLET BY MOUTH ONCE DAILY IN THE MORNING FOR BLOOD PRESSURE  . Cholecalciferol (VITAMIN D PO) Take 2,000 Units by mouth. Takes 8000 units daily  . fish oil-omega-3 fatty acids 1000 MG capsule Take 1 g by mouth daily.    . Flaxseed, Linseed, (FLAXSEED OIL) 1000 MG CAPS Take by mouth daily.    . hydrocortisone (PROCTOZONE-HC) 2.5 % rectal cream Place rectally as needed.  Marland Kitchen. lisinopril (PRINIVIL,ZESTRIL) 40 MG tablet TAKE ONE-HALF TO ONE TABLET BY MOUTH AT NIGHT FOR BLOOD PRESSURE  . Magnesium 500 MG TABS Take 1 tablet by mouth daily.  . Multiple Vitamin (MULTIVITAMIN) tablet Take 1 tablet by mouth daily.    Marland Kitchen. NEEDLE, DISP, 21 G 21G X 1" MISC Use for testosterone injections, 3 ml  . testosterone cypionate (DEPOTESTOSTERONE CYPIONATE) 200 MG/ML injection INJECT TWO ML  EVERY 2 WEEKS OR AS DIRECTED BY MD   No facility-administered medications prior to visit.    Allergies  Allergen Reactions  . Crestor [Rosuvastatin]     Elevated CPK  . Lipitor [Atorvastatin]     Myalgias  . Tricor [Fenofibrate]     Myalgias  . Vytorin [Ezetimibe-Simvastatin]     Myalgias  . Zetia [Ezetimibe]    Past Medical History:  Diagnosis Date  . Allergy   . Hemorrhoids   . Hyperlipidemia   . Hypertension   . Kidney stone   . Testosterone deficiency   . Vitamin D deficiency    Review of Systems  10 point systems review negative except as above.    Objective:   Physical Exam   BP (!) 142/84   Pulse 60   Temp 97.7 F (36.5 C)   Resp 16    Ht 5\' 6"  (1.676 m)   Wt 201 lb 1.6 oz (91.2 kg)   BMI 32.46 kg/m   Hoarse w/ brassy cough and moderate laryngitis.   HEENT - WNL except bilat frontal and sinus tenderness.  Neck - supple.  Chest - Scattered rales & coarse rhonchi . No wheezes. . Cor - Nl HS. RRR w/o sig MGR. PP 1(+). No edema. MS- FROM w/o deformities.  Gait Nl. Neuro -  Nl w/o focal abnormalities. Skin - exposed no rash or cyanosis.     Assessment & Plan:   1. Acute non-recurrent pansinusitis   2. Tracheitis  - predniSONE (DELTASONE) 20 MG tablet; 1 tab 3 x day for 3 days, then 1 tab 2 x day for 3 days, then 1 tab 1 x day for 5 days  Dispense: 20 tablet; Refill: 0  - levofloxacin (LEVAQUIN) 500 MG tablet; Take 1 tablet daily with food for infection  Dispense: 14 tablet; Refill: 1  - Recc Mucus Relief 1200 mg SR bid

## 2017-04-17 ENCOUNTER — Other Ambulatory Visit: Payer: Self-pay | Admitting: Internal Medicine

## 2017-04-17 NOTE — Telephone Encounter (Signed)
Please call Depo - Testosterone 

## 2017-08-13 ENCOUNTER — Ambulatory Visit (INDEPENDENT_AMBULATORY_CARE_PROVIDER_SITE_OTHER): Payer: Self-pay | Admitting: Physician Assistant

## 2017-08-13 ENCOUNTER — Encounter: Payer: Self-pay | Admitting: Physician Assistant

## 2017-08-13 VITALS — BP 128/88 | HR 63 | Temp 97.5°F | Resp 18 | Ht 66.0 in | Wt 193.0 lb

## 2017-08-13 DIAGNOSIS — E782 Mixed hyperlipidemia: Secondary | ICD-10-CM

## 2017-08-13 LAB — LIPID PANEL
CHOLESTEROL: 229 mg/dL — AB (ref <200)
HDL: 36 mg/dL — ABNORMAL LOW (ref >40)
LDL CHOLESTEROL (CALC): 159 mg/dL — AB
Non-HDL Cholesterol (Calc): 193 mg/dL (calc) — ABNORMAL HIGH (ref <130)
Total CHOL/HDL Ratio: 6.4 (calc) — ABNORMAL HIGH (ref <5.0)
Triglycerides: 182 mg/dL — ABNORMAL HIGH (ref <150)

## 2017-08-13 LAB — COMPREHENSIVE METABOLIC PANEL
AG RATIO: 2 (calc) (ref 1.0–2.5)
ALBUMIN MSPROF: 4.6 g/dL (ref 3.6–5.1)
ALT: 45 U/L (ref 9–46)
AST: 30 U/L (ref 10–35)
Alkaline phosphatase (APISO): 56 U/L (ref 40–115)
BILIRUBIN TOTAL: 0.6 mg/dL (ref 0.2–1.2)
BUN/Creatinine Ratio: 11 (calc) (ref 6–22)
BUN: 15 mg/dL (ref 7–25)
CALCIUM: 9.7 mg/dL (ref 8.6–10.3)
CHLORIDE: 100 mmol/L (ref 98–110)
CO2: 31 mmol/L (ref 20–32)
Creat: 1.39 mg/dL — ABNORMAL HIGH (ref 0.70–1.33)
Globulin: 2.3 g/dL (calc) (ref 1.9–3.7)
Glucose, Bld: 104 mg/dL — ABNORMAL HIGH (ref 65–99)
Potassium: 5.1 mmol/L (ref 3.5–5.3)
SODIUM: 138 mmol/L (ref 135–146)
TOTAL PROTEIN: 6.9 g/dL (ref 6.1–8.1)

## 2017-08-13 NOTE — Progress Notes (Signed)
Assessment and Plan:   Mixed hyperlipidemia -     Lipid panel -     Comprehensive metabolic panel  Cough- multifactorial -  get  H2/refer Get on allergy pill, voice rest, suppress cough with OTC sugar free candy and RX med.    need CXR Lungs CTAB  No insurance will get labs in Jan  Continue diet and meds as discussed. Further disposition pending results of labs.  HPI 56 y.o. male  presents for 3 month follow up with hypertension, hyperlipidemia, prediabetes and vitamin D.  His blood pressure has been controlled at home, today their BP is BP: 128/88  He does not workout due to time limit and decreased energy. He denies chest pain, shortness of breath, dizziness. He has been having wheezing, cough with white mucus x 2 weeks, on mucinex, zyrtec. No fever, chills, SOB, CP. He does not have inhaler. Had another infection June, no CXR.   He is not on cholesterol medication, unable to tolerate statins.  His cholesterol is not at goal. The cholesterol last visit was:   Lab Results  Component Value Date   CHOL 224 (H) 02/08/2017   HDL 29 (L) 02/08/2017   LDLCALC 144 (H) 02/08/2017   TRIG 255 (H) 02/08/2017   CHOLHDL 7.7 (H) 02/08/2017    He has been working on diet and exercise for prediabetes, and denies paresthesia of the feet, polydipsia, polyuria and visual disturbances. Last A1C in the office was:  Lab Results  Component Value Date   HGBA1C 5.3 03/30/2015   Patient is on Vitamin D supplement.   Lab Results  Component Value Date   VD25OH 71 03/30/2015     BMI is Body mass index is 31.15 kg/m., he is working on diet and exercise. Wt Readings from Last 3 Encounters:  08/13/17 193 lb (87.5 kg)  03/19/17 201 lb 1.6 oz (91.2 kg)  02/08/17 201 lb 12.8 oz (91.5 kg)   He has a history of testosterone deficiency and is on testosterone replacement, due today, gets q 2 weeks. He states that the testosterone helps with his energy, libido, muscle mass. Lab Results  Component Value Date    TESTOSTERONE 720 04/29/2015    Current Medications:  Current Outpatient Prescriptions on File Prior to Visit  Medication Sig Dispense Refill  . aspirin 81 MG chewable tablet Chew 81 mg by mouth daily.    . bisoprolol-hydrochlorothiazide (ZIAC) 10-6.25 MG tablet TAKE ONE TABLET BY MOUTH ONCE DAILY IN THE MORNING FOR BLOOD PRESSURE 90 tablet 1  . Cholecalciferol (VITAMIN D PO) Take 2,000 Units by mouth. Takes 8000 units daily    . fish oil-omega-3 fatty acids 1000 MG capsule Take 1 g by mouth daily.      . Flaxseed, Linseed, (FLAXSEED OIL) 1000 MG CAPS Take by mouth daily.      . hydrocortisone (PROCTOZONE-HC) 2.5 % rectal cream Place rectally as needed.    Marland Kitchen. lisinopril (PRINIVIL,ZESTRIL) 40 MG tablet TAKE ONE-HALF TO ONE TABLET BY MOUTH AT NIGHT FOR BLOOD PRESSURE 90 tablet 1  . Magnesium 500 MG TABS Take 1 tablet by mouth daily.    . Multiple Vitamin (MULTIVITAMIN) tablet Take 1 tablet by mouth daily.      Marland Kitchen. NEEDLE, DISP, 21 G 21G X 1" MISC Use for testosterone injections, 3 ml 100 each 0  . testosterone cypionate (DEPOTESTOSTERONE CYPIONATE) 200 MG/ML injection INJECT 2 ML EVERY TWO WEEKS AS DIRECTED 10 mL 3   No current facility-administered medications on file prior to  visit.    Medical History:  Past Medical History:  Diagnosis Date  . Allergy   . Hemorrhoids   . Hyperlipidemia   . Hypertension   . Kidney stone   . Testosterone deficiency   . Vitamin D deficiency    Allergies:  Allergies  Allergen Reactions  . Crestor [Rosuvastatin]     Elevated CPK  . Lipitor [Atorvastatin]     Myalgias  . Tricor [Fenofibrate]     Myalgias  . Vytorin [Ezetimibe-Simvastatin]     Myalgias  . Zetia [Ezetimibe]      Review of Systems:  Review of Systems  Constitutional: Negative for chills, diaphoresis, fever, malaise/fatigue and weight loss.  HENT: Negative.   Eyes: Negative.   Respiratory: Positive for cough. Negative for hemoptysis, sputum production, shortness of breath and  wheezing.   Cardiovascular: Negative.   Gastrointestinal: Negative.   Genitourinary: Negative.   Musculoskeletal: Negative.   Skin: Negative.   Neurological: Negative.  Negative for weakness.  Endo/Heme/Allergies: Negative.   Psychiatric/Behavioral: Negative.     Family history- Review and unchanged Social history- Review and unchanged Physical Exam: BP 128/88   Pulse 63   Temp (!) 97.5 F (36.4 C)   Resp 18   Ht 5\' 6"  (1.676 m)   Wt 193 lb (87.5 kg)   SpO2 98%   BMI 31.15 kg/m  Wt Readings from Last 3 Encounters:  08/13/17 193 lb (87.5 kg)  03/19/17 201 lb 1.6 oz (91.2 kg)  02/08/17 201 lb 12.8 oz (91.5 kg)   General Appearance: Well nourished, in no apparent distress. Eyes: PERRLA, EOMs, conjunctiva no swelling or erythema Sinuses: No Frontal/maxillary tenderness ENT/Mouth: Ext aud canals clear, TMs without erythema, bulging. No erythema, swelling, or exudate on post pharynx.  Tonsils not swollen or erythematous. Hearing normal.  Neck: Supple, thyroid normal.  Respiratory: Respiratory effort normal, BS equal bilaterally without rales, rhonchi, wheezing or stridor.  Cardio: RRR with no MRGs. Brisk peripheral pulses without edema.  Abdomen: Soft, + BS,  Non tender, no guarding, rebound, hernias, masses. Lymphatics: Non tender without lymphadenopathy.  Musculoskeletal: Full ROM, 5/5 strength, Normal gait Skin: Warm, dry without rashes, lesions, ecchymosis.  Neuro: Cranial nerves intact. Normal muscle tone, no cerebellar symptoms. Psych: Awake and oriented X 3, normal affect, Insight and Judgment appropriate.    Quentin Mulling, PA-C 10:56 AM [facility]

## 2017-08-13 NOTE — Patient Instructions (Addendum)
zantac 150-300 mg or pepcid at night for 2 weeks CAN DO GENERIC, then you can stop.  Avoid alcohol, spicy foods, NSAIDS (aleve, ibuprofen) at this time. See foods below.   Food Choices for Gastroesophageal Reflux Disease When you have gastroesophageal reflux disease (GERD), the foods you eat and your eating habits are very important. Choosing the right foods can help ease the discomfort of GERD. WHAT GENERAL GUIDELINES DO I NEED TO FOLLOW?  Choose fruits, vegetables, whole grains, low-fat dairy products, and low-fat meat, fish, and poultry.  Limit fats such as oils, salad dressings, butter, nuts, and avocado.  Keep a food diary to identify foods that cause symptoms.  Avoid foods that cause reflux. These may be different for different people.  Eat frequent small meals instead of three large meals each day.  Eat your meals slowly, in a relaxed setting.  Limit fried foods.  Cook foods using methods other than frying.  Avoid drinking alcohol.  Avoid drinking large amounts of liquids with your meals.  Avoid bending over or lying down until 2-3 hours after eating. WHAT FOODS ARE NOT RECOMMENDED? The following are some foods and drinks that may worsen your symptoms: Vegetables Tomatoes. Tomato juice. Tomato and spaghetti sauce. Chili peppers. Onion and garlic. Horseradish. Fruits Oranges, grapefruit, and lemon (fruit and juice). Meats High-fat meats, fish, and poultry. This includes hot dogs, ribs, ham, sausage, salami, and bacon. Dairy Whole milk and chocolate milk. Sour cream. Cream. Butter. Ice cream. Cream cheese.  Beverages Coffee and tea, with or without caffeine. Carbonated beverages or energy drinks. Condiments Hot sauce. Barbecue sauce.  Sweets/Desserts Chocolate and cocoa. Donuts. Peppermint and spearmint. Fats and Oils High-fat foods, including [place] fries and potato chips. Other Vinegar. Strong spices, such as black pepper, white pepper, red pepper, cayenne, curry  powder, cloves, ginger, and chili powder.  Can do samples steroid inhaler if do not tolerate oral steroids, do 1 puff twice a day and wash mouth out afterwards to avoid yeast.  CAN DO BEFORE YOU BRUSH YOUR TEETH   Bronchospasm, Adult Bronchospasm is a tightening of the airways going into the lungs. During an episode, it may be harder to breathe. You may cough, and you may make a whistling sound when you breathe (wheeze). This condition often affects people with asthma. What are the causes? This condition is caused by swelling and irritation in the airways. It can be triggered by:  An infection (common).  Seasonal allergies.  An allergic reaction.  Exercise.  Irritants. These include pollution, cigarette smoke, strong odors, aerosol sprays, and paint fumes.  Weather changes. Winds increase molds and pollens in the air. Cold air may cause swelling.  Stress and emotional upset.  What are the signs or symptoms? Symptoms of this condition include:  Wheezing. If the episode was triggered by an allergy, wheezing may start right away or hours later.  Nighttime coughing.  Frequent or severe coughing with a simple cold.  Chest tightness.  Shortness of breath.  Decreased ability to exercise.  How is this diagnosed? This condition is usually diagnosed with a review of your medical history and a physical exam. Tests, such as lung function tests, are sometimes done to look for other conditions. The need for a chest X-ray depends on where the wheezing occurs and whether it is the first time you have wheezed. How is this treated? This condition may be treated with:  Inhaled medicines. These open up the airways and help you breathe. They can be taken with  an inhaler or a nebulizer device.  Corticosteroid medicines. These may be given for severe bronchospasm, usually when it is associated with asthma.  Avoiding triggers, such as irritants, infection, or allergies.  Follow these  instructions at home: Medicines  Take over-the-counter and prescription medicines only as told by your health care provider.  If you need to use an inhaler or nebulizer to take your medicine, ask your health care provider to explain how to use it correctly. If you were given a spacer, always use it with your inhaler. Lifestyle  Reduce the number of triggers in your home. To do this: ? Change your heating and air conditioning filter at least once a month. ? Limit your use of fireplaces and wood stoves. ? Do not smoke. Do not allow smoking in your home. ? Avoid using perfumes and fragrances. ? Get rid of pests, such as roaches and mice, and their droppings. ? Remove any mold from your home. ? Keep your house clean and dust free. Use unscented cleaning products. ? Replace carpet with wood, tile, or vinyl flooring. Carpet can trap dander and dust. ? Use allergy-proof pillows, mattress covers, and box spring covers. ? Wash bed sheets and blankets every week in hot water. Dry them in a dryer. ? Use blankets that are made of polyester or cotton. ? Wash your hands often. ? Do not allow pets in your bedroom.  Avoid breathing in cold air when you exercise. General instructions  Have a plan for seeking medical care. Know when to call your health care provider and local emergency services, and where to get emergency care.  Stay up to date on your immunizations.  When you have an episode of bronchospasm, stay calm. Try to relax and breathe more slowly.  If you have asthma, make sure you have an asthma action plan.  Keep all follow-up visits as told by your health care provider. This is important. Contact a health care provider if:  You have muscle aches.  You have chest pain.  The mucus that you cough up (sputum) changes from clear or white to yellow, green, gray, or bloody.  You have a fever.  Your sputum gets thicker. Get help right away if:  Your wheezing and coughing get worse,  even after you take your prescribed medicines.  It gets even harder to breathe.  You develop severe chest pain. Summary  Bronchospasm is a tightening of the airways going into the lungs.  During an episode of bronchospasm, you may have a harder time breathing. You may cough and make a whistling sound when you breathe (wheeze).  Avoid exposure to triggers such as smoke, dust, mold, animal dander, and fragrances.  When you have an episode of bronchospasm, stay calm. Try to relax and breathe more slowly. This information is not intended to replace advice given to you by your health care provider. Make sure you discuss any questions you have with your health care provider. Document Released: 10/05/2003 Document Revised: 09/28/2016 Document Reviewed: 09/28/2016 Elsevier Interactive Patient Education  2017 [employer].

## 2017-08-15 MED ORDER — PRAVASTATIN SODIUM 20 MG PO TABS: 20.0000 mg | ORAL_TABLET | Freq: Every evening | ORAL | 3 refills | Status: DC

## 2017-08-15 NOTE — Addendum Note (Signed)
Addended by: Quentin MullingOLLIER, AMANDA R on: 08/15/2017 01:45 PM   Modules accepted: Orders

## 2017-10-25 ENCOUNTER — Other Ambulatory Visit: Payer: Self-pay | Admitting: Internal Medicine

## 2017-10-25 MED ORDER — TESTOSTERONE CYPIONATE 200 MG/ML IM SOLN: INTRAMUSCULAR | 2 refills | Status: DC

## 2017-11-19 ENCOUNTER — Other Ambulatory Visit: Payer: Self-pay | Admitting: Internal Medicine

## 2017-11-19 DIAGNOSIS — I10 Essential (primary) hypertension: Secondary | ICD-10-CM

## 2017-11-26 ENCOUNTER — Ambulatory Visit: Payer: Self-pay | Admitting: Physician Assistant

## 2018-01-09 ENCOUNTER — Encounter: Payer: Self-pay | Admitting: Internal Medicine

## 2018-03-12 ENCOUNTER — Encounter: Payer: Self-pay | Admitting: Internal Medicine

## 2018-03-12 ENCOUNTER — Ambulatory Visit: Payer: BLUE CROSS/BLUE SHIELD | Admitting: Internal Medicine

## 2018-03-12 VITALS — BP 136/86 | HR 64 | Temp 97.5°F | Resp 18 | Ht 66.5 in | Wt 198.4 lb

## 2018-03-12 DIAGNOSIS — N401 Enlarged prostate with lower urinary tract symptoms: Secondary | ICD-10-CM | POA: Diagnosis not present

## 2018-03-12 DIAGNOSIS — Z131 Encounter for screening for diabetes mellitus: Secondary | ICD-10-CM | POA: Diagnosis not present

## 2018-03-12 DIAGNOSIS — Z130 Encounter for screening for diseases of the blood and blood-forming organs and certain disorders involving the immune mechanism: Secondary | ICD-10-CM | POA: Diagnosis not present

## 2018-03-12 DIAGNOSIS — Z136 Encounter for screening for cardiovascular disorders: Secondary | ICD-10-CM

## 2018-03-12 DIAGNOSIS — Z125 Encounter for screening for malignant neoplasm of prostate: Secondary | ICD-10-CM | POA: Diagnosis not present

## 2018-03-12 DIAGNOSIS — Z13220 Encounter for screening for lipoid disorders: Secondary | ICD-10-CM

## 2018-03-12 DIAGNOSIS — Z1389 Encounter for screening for other disorder: Secondary | ICD-10-CM | POA: Diagnosis not present

## 2018-03-12 DIAGNOSIS — E559 Vitamin D deficiency, unspecified: Secondary | ICD-10-CM | POA: Diagnosis not present

## 2018-03-12 DIAGNOSIS — E349 Endocrine disorder, unspecified: Secondary | ICD-10-CM

## 2018-03-12 DIAGNOSIS — Z111 Encounter for screening for respiratory tuberculosis: Secondary | ICD-10-CM | POA: Diagnosis not present

## 2018-03-12 DIAGNOSIS — Z1212 Encounter for screening for malignant neoplasm of rectum: Secondary | ICD-10-CM

## 2018-03-12 DIAGNOSIS — Z1211 Encounter for screening for malignant neoplasm of colon: Secondary | ICD-10-CM

## 2018-03-12 DIAGNOSIS — Z1329 Encounter for screening for other suspected endocrine disorder: Secondary | ICD-10-CM | POA: Diagnosis not present

## 2018-03-12 DIAGNOSIS — R5383 Other fatigue: Secondary | ICD-10-CM

## 2018-03-12 DIAGNOSIS — Z79899 Other long term (current) drug therapy: Secondary | ICD-10-CM | POA: Diagnosis not present

## 2018-03-12 DIAGNOSIS — E782 Mixed hyperlipidemia: Secondary | ICD-10-CM

## 2018-03-12 DIAGNOSIS — I10 Essential (primary) hypertension: Secondary | ICD-10-CM | POA: Diagnosis not present

## 2018-03-12 DIAGNOSIS — R7303 Prediabetes: Secondary | ICD-10-CM

## 2018-03-12 DIAGNOSIS — R350 Frequency of micturition: Secondary | ICD-10-CM | POA: Diagnosis not present

## 2018-03-12 DIAGNOSIS — Z6832 Body mass index (BMI) 32.0-32.9, adult: Secondary | ICD-10-CM

## 2018-03-12 DIAGNOSIS — Z23 Encounter for immunization: Secondary | ICD-10-CM

## 2018-03-12 DIAGNOSIS — Z0000 Encounter for general adult medical examination without abnormal findings: Secondary | ICD-10-CM

## 2018-03-12 DIAGNOSIS — Z0001 Encounter for general adult medical examination with abnormal findings: Secondary | ICD-10-CM

## 2018-03-12 DIAGNOSIS — Z8249 Family history of ischemic heart disease and other diseases of the circulatory system: Secondary | ICD-10-CM

## 2018-03-12 NOTE — Progress Notes (Signed)
[facility]   Lucky Cowboy, M.D.     Dyanne Carrel. Steffanie Dunn, P.A.-C Judd Gaudier, DNP [facility]                [address]                [place], [place]. [address] Telephone [phone] Telefax [phone] Annual  Screening/Preventative Visit  & Comprehensive Evaluation & Examination     This very nice 57 y.o. single WM presents for a Screening/Preventative Visit & comprehensive evaluation and management of multiple medical co-morbidities.  Patient has been followed for HTN, HLD, T2_NIDDM  Prediabetes and Vitamin D Deficiency.     Patient is followed expectantly for labile HTN since 2005. Patient's BP has been controlled and today's BP was initially sl elevated and rechecked at goal - 136/86. Patient denies any cardiac symptoms as chest pain, palpitations, shortness of breath, dizziness or ankle swelling.     Patient's hyperlipidemia is not controlled with diet and Pravastatin was started. Patient reported  myalgias on pravastatin and d/c'd. Last lipids off meds were not at goal: Lab Results  Component Value Date   CHOL 232 (H) 03/12/2018   HDL 37 (L) 03/12/2018   LDLCALC 152 (H) 03/12/2018   TRIG 260 (H) 03/12/2018   CHOLHDL 6.3 (H) 03/12/2018      Patient has prediabetes W/Insulin Resistance (A1c 5.0%/Insulin 36/2015) and patient denies reactive hypoglycemic symptoms, visual blurring, diabetic polys or paresthesias. Last A1c was at goal: Lab Results  Component Value Date   HGBA1C 5.3 03/30/2015       Patient has hx/o Testosterone Deficiency & is on parenteral replacement with improved stamina and mood.      Finally, patient has history of Vitamin D Deficiency ("23"/2008) and last vitamin D was at goal: Lab Results  Component Value Date   VD25OH 71 03/30/2015   Current Outpatient Medications on File Prior to Visit  Medication Sig  . aspirin 81 MG chewable tablet Chew 81 mg by mouth daily.  .  bisoprolol-hydrochlorothiazide (ZIAC) 10-6.25 MG tablet TAKE ONE TABLET BY MOUTH IN THE MORNING FOR  BLOOD  PRESSURE  . Cholecalciferol (VITAMIN D PO) Take 2,000 Units by mouth. Takes 8000 units daily  . fish oil-omega-3 fatty acids 1000 MG capsule Take 1 g by mouth daily.    . Flaxseed, Linseed, (FLAXSEED OIL) 1000 MG CAPS Take by mouth daily.    . hydrocortisone (PROCTOZONE-HC) 2.5 % rectal cream Place rectally as needed.  Marland Kitchen lisinopril (PRINIVIL,ZESTRIL) 40 MG tablet TAKE ONE-HALF TO ONE TABLET BY MOUTH AT NIGHT FOR BLOOD PRESSURE  . Magnesium 500 MG TABS Take 1 tablet by mouth daily.  . Multiple Vitamin (MULTIVITAMIN) tablet Take 1 tablet by mouth daily.    Marland Kitchen NEEDLE, DISP, 21 G 21G X 1" MISC Use for testosterone injections, 3 ml  . testosterone cypionate (DEPOTESTOSTERONE CYPIONATE) 200 MG/ML injection INJECT 2 ML EVERY TWO WEEKS AS DIRECTED   No current facility-administered medications on file prior to visit.    Allergies  Allergen Reactions  . Pravastatin Sodium Other (See Comments)    myalgias  . Crestor [Rosuvastatin]     Elevated CPK  . Lipitor [Atorvastatin]     Myalgias  . Tricor [Fenofibrate]     Myalgias  . Vytorin [Ezetimibe-Simvastatin]     Myalgias  . Zetia [Ezetimibe]    Past Medical History:  Diagnosis Date  . Allergy   . Hemorrhoids   . Hyperlipidemia   .  Hypertension   . Kidney stone   . Testosterone deficiency   . Vitamin D deficiency    Health Maintenance  Topic Date Due  . Hepatitis C Screening  Jan 09, 1961  . TETANUS/TDAP  04/04/1982  . INFLUENZA VACCINE  05/16/2018  . COLONOSCOPY  12/11/2021  . HIV Screening  Completed   Immunization History  Administered Date(s) Administered  . DT 10/19/2003  . Influenza Split 07/10/2013  . PPD Test 03/12/2018  . Pneumococcal Polysaccharide-23 03/03/2009  . Td 03/12/2018   Last Colon - 02.26.2013 - [facility] - Recc 10 yr f/u - Feb 2023  Past Surgical History:  Procedure Laterality Date  . BASAL CELL  CARCINOMA EXCISION     rt. eyelid  . COLONOSCOPY  12/12/2011   Procedure: COLONOSCOPY;  Surgeon: Iva Boop, MD;  Location: [facility];  Service: Endoscopy;  Laterality: N/A;  with possible hemorrhoid banding  . WISDOM TOOTH EXTRACTION  1999   Family History  Problem Relation Age of Onset  . Colon cancer Neg Hx   . Esophageal cancer Neg Hx   . Stomach cancer Neg Hx   . Diabetes Mother   . Diabetes Unknown        Maternal grandmother  . Breast cancer Unknown        Maternal grandmother  . Lung cancer Father    Social History   Socioeconomic History  . Marital status: Single    Spouse name: Not on file  Occupational History  . Occupation: Sports administrator: Research officer, [employer]  / [employer]  Tobacco Use  . Smoking status: Never Smoker  . Smokeless tobacco: Never Used  Substance and Sexual Activity  . Alcohol use: Yes    Alcohol/week: 3.5 oz    Types: 7 drink(s) per week    Comment: 1 mixed drink per month  . Drug use: No  . Sexual activity: Not on file    ROS Constitutional: Denies fever, chills, weight loss/gain, headaches, insomnia,  night sweats or change in appetite. Does c/o fatigue. Eyes: Denies redness, blurred vision, diplopia, discharge, itchy or watery eyes.  ENT: Denies discharge, congestion, post nasal drip, epistaxis, sore throat, earache, hearing loss, dental pain, Tinnitus, Vertigo, Sinus pain or snoring.  Cardio: Denies chest pain, palpitations, irregular heartbeat, syncope, dyspnea, diaphoresis, orthopnea, PND, claudication or edema Respiratory: denies cough, dyspnea, DOE, pleurisy, hoarseness, laryngitis or wheezing.  Gastrointestinal: Denies dysphagia, heartburn, reflux, water brash, pain, cramps, nausea, vomiting, bloating, diarrhea, constipation, hematemesis, melena, hematochezia, jaundice or hemorrhoids Genitourinary: Denies dysuria, frequency, urgency, nocturia, hesitancy, discharge, hematuria or flank pain Musculoskeletal: Denies  arthralgia, myalgia, stiffness, Jt. Swelling, pain, limp or strain/sprain. Denies Falls. Skin: Denies puritis, rash, hives, warts, acne, eczema or change in skin lesion Neuro: No weakness, tremor, incoordination, spasms, paresthesia or pain Psychiatric: Denies confusion, memory loss or sensory loss. Denies Depression. Endocrine: Denies change in weight, skin, hair change, nocturia, and paresthesia, diabetic polys, visual blurring or hyper / hypo glycemic episodes.  Heme/Lymph: No excessive bleeding, bruising or enlarged lymph nodes.  Physical Exam  BP 136/86   Pulse 64   Temp (!) 97.5 F (36.4 C)   Resp 18   Ht 5' 6.5" (1.689 m)   Wt 198 lb 6.4 oz (90 kg)   BMI 31.54 kg/m   General Appearance: Well nourished and well groomed and in no apparent distress.  Eyes: PERRLA, EOMs, conjunctiva no swelling or erythema, normal fundi and vessels. Sinuses: No frontal/maxillary tenderness ENT/Mouth: EACs patent / TMs  nl. Nares  clear without erythema, swelling, mucoid exudates. Oral hygiene is good. No erythema, swelling, or exudate. Tongue normal, non-obstructing. Tonsils not swollen or erythematous. Hearing normal.  Neck: Supple, thyroid not palpable. No bruits, nodes or JVD. Respiratory: Respiratory effort normal.  BS equal and clear bilateral without rales, rhonci, wheezing or stridor. Cardio: Heart sounds are normal with regular rate and rhythm and no murmurs, rubs or gallops. Peripheral pulses are normal and equal bilaterally without edema. No aortic or femoral bruits. Chest: symmetric with normal excursions and percussion.  Abdomen: Soft, with Nl bowel sounds. Nontender, no guarding, rebound, hernias, masses, or organomegaly.  Lymphatics: Non tender without lymphadenopathy.  Genitourinary: No hernias.Testes nl. DRE - prostate nl for age - smooth & firm w/o nodules. Musculoskeletal: Full ROM all peripheral extremities, joint stability, 5/5 strength, and normal gait. Skin: Warm and dry  without rashes, lesions, cyanosis, clubbing or  ecchymosis.  Neuro: Cranial nerves intact, reflexes equal bilaterally. Normal muscle tone, no cerebellar symptoms. Sensation intact.  Pysch: Alert and oriented X 3 with normal affect, insight and judgment appropriate.   Assessment and Plan  1. Annual Preventative/Screening Exam   2. Essential hypertension  - EKG 12-Lead - [place], RETROPERITNL ABD,  LTD - Urinalysis, Routine w reflex microscopic - Microalbumin / creatinine urine ratio - CBC with Differential/Platelet - COMPLETE METABOLIC PANEL WITH GFR - Magnesium - TSH  3. Hyperlipidemia, mixed  - EKG 12-Lead - [place], RETROPERITNL ABD,  LTD - Lipid panel  4. Prediabetes  - EKG 12-Lead - [place], RETROPERITNL ABD,  LTD - Hemoglobin A1c - Insulin, random  5. Vitamin D deficiency  - VITAMIN D 25 Hydroxyl  6. Testosterone deficiency  - Testosterone  7. Encounter for colorectal cancer screening  - POC Hemoccult Bld/Stl  8. Prostate cancer screening  - PSA  9. Screening for ischemic heart disease  - EKG 12-Lead  10. FH: hypertension  - EKG 12-Lead - [place], RETROPERITNL ABD,  LTD  11. Screening for AAA (aortic abdominal aneurysm)  - [place], RETROPERITNL ABD,  LTD  12. Fatigue, unspecified type  - Iron,Total/Total Iron Binding Cap - Vitamin B12 - Testosterone - CBC with Differential/Platelet - TSH  13. Medication management  - Urinalysis, Routine w reflex microscopic - Microalbumin / creatinine urine ratio - Testosterone - CBC with Differential/Platelet - COMPLETE METABOLIC PANEL WITH GFR - Magnesium - Lipid panel - TSH - Hemoglobin A1c - Insulin, random - VITAMIN D 25 Hydroxyl       Patient was counseled in prudent diet, weight control to achieve/maintain BMI less than 25, BP monitoring, regular exercise and medications as discussed.  Discussed med effects and SE's. Routine screening labs and tests as requested with regular follow-up as recommended. Over 40  minutes of exam, counseling, chart review and high complex critical decision making was performed

## 2018-03-12 NOTE — Patient Instructions (Signed)

## 2018-03-13 ENCOUNTER — Other Ambulatory Visit: Payer: Self-pay | Admitting: Internal Medicine

## 2018-03-13 MED ORDER — EZETIMIBE 10 MG PO TABS: 10.0000 mg | ORAL_TABLET | Freq: Every day | ORAL | 1 refills | Status: DC

## 2018-03-14 LAB — COMPLETE METABOLIC PANEL WITH GFR
AG RATIO: 2.1 (calc) (ref 1.0–2.5)
ALKALINE PHOSPHATASE (APISO): 58 U/L (ref 40–115)
ALT: 60 U/L — AB (ref 9–46)
AST: 35 U/L (ref 10–35)
Albumin: 4.7 g/dL (ref 3.6–5.1)
BILIRUBIN TOTAL: 0.7 mg/dL (ref 0.2–1.2)
BUN: 17 mg/dL (ref 7–25)
CHLORIDE: 101 mmol/L (ref 98–110)
CO2: 31 mmol/L (ref 20–32)
Calcium: 10.1 mg/dL (ref 8.6–10.3)
Creat: 1.19 mg/dL (ref 0.70–1.33)
GFR, EST AFRICAN AMERICAN: 80 mL/min/{1.73_m2} (ref >=60)
GFR, Est Non African American: 69 mL/min/{1.73_m2} (ref >=60)
GLUCOSE: 73 mg/dL (ref 65–99)
Globulin: 2.2 g/dL (calc) (ref 1.9–3.7)
POTASSIUM: 4.6 mmol/L (ref 3.5–5.3)
Sodium: 140 mmol/L (ref 135–146)
TOTAL PROTEIN: 6.9 g/dL (ref 6.1–8.1)

## 2018-03-14 LAB — CBC WITH DIFFERENTIAL/PLATELET
BASOS ABS: 52 {cells}/uL (ref 0–200)
BASOS PCT: 0.8 %
EOS ABS: 137 {cells}/uL (ref 15–500)
Eosinophils Relative: 2.1 %
HCT: 52.3 % — ABNORMAL HIGH (ref 38.5–50.0)
Hemoglobin: 18.3 g/dL — ABNORMAL HIGH (ref 13.2–17.1)
Lymphs Abs: 2113 cells/uL (ref 850–3900)
MCH: 29.8 pg (ref 27.0–33.0)
MCHC: 35 g/dL (ref 32.0–36.0)
MCV: 85 fL (ref 80.0–100.0)
MONOS PCT: 6.3 %
MPV: 11.1 fL (ref 7.5–12.5)
NEUTROS PCT: 58.3 %
Neutro Abs: 3790 cells/uL (ref 1500–7800)
PLATELETS: 223 10*3/uL (ref 140–400)
RBC: 6.15 10*6/uL — ABNORMAL HIGH (ref 4.20–5.80)
RDW: 13 % (ref 11.0–15.0)
TOTAL LYMPHOCYTE: 32.5 %
WBC: 6.5 10*3/uL (ref 3.8–10.8)
WBCMIX: 410 {cells}/uL (ref 200–950)

## 2018-03-14 LAB — TSH: TSH: 2.53 m[IU]/L (ref 0.40–4.50)

## 2018-03-14 LAB — IRON, TOTAL/TOTAL IRON BINDING CAP
%SAT: 43 % (calc) (ref 15–60)
IRON: 157 ug/dL (ref 50–180)
TIBC: 361 ug/dL (ref 250–425)

## 2018-03-14 LAB — LIPID PANEL
Cholesterol: 232 mg/dL — ABNORMAL HIGH (ref <200)
HDL: 37 mg/dL — ABNORMAL LOW (ref >40)
LDL Cholesterol (Calc): 152 mg/dL (calc) — ABNORMAL HIGH
NON-HDL CHOLESTEROL (CALC): 195 mg/dL — AB (ref <130)
Total CHOL/HDL Ratio: 6.3 (calc) — ABNORMAL HIGH (ref <5.0)
Triglycerides: 260 mg/dL — ABNORMAL HIGH (ref <150)

## 2018-03-14 LAB — URINALYSIS, ROUTINE W REFLEX MICROSCOPIC
BACTERIA UA: NONE SEEN /HPF
Bilirubin Urine: NEGATIVE
Glucose, UA: NEGATIVE
HYALINE CAST: NONE SEEN /LPF
Hgb urine dipstick: NEGATIVE
Ketones, ur: NEGATIVE
Nitrite: NEGATIVE
PH: 6.5 (ref 5.0–8.0)
Protein, ur: NEGATIVE
RBC / HPF: NONE SEEN /HPF (ref 0–2)
SPECIFIC GRAVITY, URINE: 1.009 (ref 1.001–1.03)
Squamous Epithelial / LPF: NONE SEEN /HPF (ref <=5)
WBC, UA: NONE SEEN /HPF (ref 0–5)

## 2018-03-14 LAB — INSULIN, RANDOM: Insulin: 13.5 u[IU]/mL (ref 2.0–19.6)

## 2018-03-14 LAB — HEMOGLOBIN A1C
EAG (MMOL/L): 6 (calc)
HEMOGLOBIN A1C: 5.4 %{Hb} (ref <5.7)
Mean Plasma Glucose: 108 (calc)

## 2018-03-14 LAB — MICROALBUMIN / CREATININE URINE RATIO
Creatinine, Urine: 41 mg/dL (ref 20–320)
MICROALB UR: 0.2 mg/dL
MICROALB/CREAT RATIO: 5 ug/mg{creat} (ref <30)

## 2018-03-14 LAB — TB SKIN TEST
INDURATION: 0 mm
TB SKIN TEST: NEGATIVE

## 2018-03-14 LAB — VITAMIN D 25 HYDROXY (VIT D DEFICIENCY, FRACTURES): VIT D 25 HYDROXY: 70 ng/mL (ref 30–100)

## 2018-03-14 LAB — MAGNESIUM: MAGNESIUM: 2.1 mg/dL (ref 1.5–2.5)

## 2018-03-14 LAB — VITAMIN B12: VITAMIN B 12: 612 pg/mL (ref 200–1100)

## 2018-03-14 LAB — PSA: PSA: 1.2 ng/mL (ref <=4.0)

## 2018-03-14 LAB — TESTOSTERONE: Testosterone: 48 ng/dL — ABNORMAL LOW (ref 250–827)

## 2018-05-06 ENCOUNTER — Other Ambulatory Visit: Payer: Self-pay | Admitting: Internal Medicine

## 2018-05-06 DIAGNOSIS — I10 Essential (primary) hypertension: Secondary | ICD-10-CM

## 2018-05-20 ENCOUNTER — Other Ambulatory Visit: Payer: Self-pay | Admitting: Adult Health

## 2018-05-20 DIAGNOSIS — I10 Essential (primary) hypertension: Secondary | ICD-10-CM

## 2018-06-26 NOTE — Progress Notes (Signed)
FOLLOW UP  Assessment and Plan:   Hypertension Well controlled with current medications  Monitor blood pressure at home; patient to call if consistently greater than 130/80 Continue DASH diet.   Reminder to go to the ER if any CP, SOB, nausea, dizziness, severe HA, changes vision/speech, left arm numbness and tingling and jaw pain.  Cholesterol Currently above goal; hx of statin intolerance, currently on zetia 10 mg daily, omega 3 - suggested he add soluble fiber supplement, discussed initial LDL goal <130 Continue low cholesterol diet and exercise.  Check lipid panel.   Other abnormal glucose Recent A1Cs at goal Discussed diet/exercise, weight management  Defer A1C; check CMP  Obesity with co morbidities Long discussion about weight loss, diet, and exercise Recommended diet heavy in fruits and veggies and low in animal meats, cheeses, and dairy products, appropriate calorie intake Discussed ideal weight for height and initial weight goal (<190lb) Make mindful choices during the holidays Will follow up in 3 months  Vitamin D Def At goal at last visit; continue supplementation to maintain goal of 70-100 Defer Vit D level  Hypogonadism - continue to monitor, states medication is helping with symptoms of low T.    Continue diet and meds as discussed. Further disposition pending results of labs. Discussed med's effects and SE's.   Over 30 minutes of exam, counseling, chart review, and critical decision making was performed.   Future Appointments  Date Time Provider Department Center  10/07/2018  9:30 AM Lucky Cowboy, MD [facility] None  04/02/2019  2:00 PM Lucky Cowboy, MD [facility] None    ----------------------------------------------------------------------------------------------------------------------  HPI 57 y.o. male  presents for 3 month follow up on hypertension, cholesterol, glucose management, obesity, hypogonadism and vitamin D deficiency.   BMI is  Body mass index is 31.61 kg/m., he has been working on diet, reducing red meat and dairy, works a physically intense job.  Wt Readings from Last 3 Encounters:  07/01/18 198 lb 12.8 oz (90.2 kg)  03/12/18 198 lb 6.4 oz (90 kg)  08/13/17 193 lb (87.5 kg)   His blood pressure has been controlled at home (120/70-80), today their BP is BP: 132/82  He does not workout. He denies chest pain, shortness of breath, dizziness.   He is on cholesterol medication (zetia 10 mg daily, hx of intolerance of multiple statins) and denies myalgias. His cholesterol is not at goal. The cholesterol last visit was:   Lab Results  Component Value Date   CHOL 232 (H) 03/12/2018   HDL 37 (L) 03/12/2018   LDLCALC 152 (H) 03/12/2018   TRIG 260 (H) 03/12/2018   CHOLHDL 6.3 (H) 03/12/2018    He has been working on diet and exercise for glucose management, and denies foot ulcerations, increased appetite, nausea, paresthesia of the feet, polydipsia, polyuria, visual disturbances and vomiting. Last A1C in the office was:  Lab Results  Component Value Date   HGBA1C 5.4 03/12/2018   Patient is on Vitamin D supplement.   Lab Results  Component Value Date   VD25OH 19 03/12/2018     He has a history of testosterone deficiency and is on testosterone replacement. He states that the testosterone helps with his energy, libido, muscle mass. Lab Results  Component Value Date   TESTOSTERONE 48 (L) 03/12/2018      Current Medications:  Current Outpatient Medications on File Prior to Visit  Medication Sig  . aspirin 81 MG chewable tablet Chew 81 mg by mouth daily.  . bisoprolol-hydrochlorothiazide (ZIAC) 10-6.25 MG tablet  TAKE 1 TABLET BY MOUTH IN THE MORNING FOR BLOOD PRESSURE  . Cholecalciferol (VITAMIN D PO) Take 2,000 Units by mouth. Takes 8000 units daily  . ezetimibe (ZETIA) 10 MG tablet Take 1 tablet (10 mg total) by mouth daily.  . fish oil-omega-3 fatty acids 1000 MG capsule Take 1 g by mouth daily.    .  Flaxseed, Linseed, (FLAXSEED OIL) 1000 MG CAPS Take by mouth daily.    . hydrocortisone (PROCTOZONE-HC) 2.5 % rectal cream Place rectally as needed.  Marland Kitchen lisinopril (PRINIVIL,ZESTRIL) 40 MG tablet TAKE 1/2 TO 1 TABLET BY MOUTH ONCE DAILY FOR BLOOD PRESSURE  . Magnesium 500 MG TABS Take 1 tablet by mouth daily.  . Multiple Vitamin (MULTIVITAMIN) tablet Take 1 tablet by mouth daily.    Marland Kitchen NEEDLE, DISP, 21 G 21G X 1" MISC Use for testosterone injections, 3 ml  . testosterone cypionate (DEPOTESTOSTERONE CYPIONATE) 200 MG/ML injection INJECT 2 ML (CC) INTRAMUSCULARLY EVERY TWO WEEKS   No current facility-administered medications on file prior to visit.      Allergies:  Allergies  Allergen Reactions  . Pravastatin Sodium Other (See Comments)    myalgias  . Crestor [Rosuvastatin]     Elevated CPK  . Lipitor [Atorvastatin]     Myalgias  . Tricor [Fenofibrate]     Myalgias  . Vytorin [Ezetimibe-Simvastatin]     Myalgias     Medical History:  Past Medical History:  Diagnosis Date  . Allergy   . Hemorrhoids   . Hyperlipidemia   . Hypertension   . Kidney stone   . Testosterone deficiency   . Vitamin D deficiency    Family history- Reviewed and unchanged Social history- Reviewed and unchanged   Review of Systems:  Review of Systems  Constitutional: Negative for malaise/fatigue and weight loss.  HENT: Negative for hearing loss and tinnitus.   Eyes: Negative for blurred vision and double vision.  Respiratory: Negative for cough, shortness of breath and wheezing.   Cardiovascular: Negative for chest pain, palpitations, orthopnea, claudication and leg swelling.  Gastrointestinal: Negative for abdominal pain, blood in stool, constipation, diarrhea, heartburn, melena, nausea and vomiting.  Genitourinary: Negative.   Musculoskeletal: Negative for joint pain and myalgias.  Skin: Negative for rash.  Neurological: Negative for dizziness, tingling, sensory change, weakness and headaches.   Endo/Heme/Allergies: Negative for polydipsia.  Psychiatric/Behavioral: Negative.   All other systems reviewed and are negative.     Physical Exam: BP 132/82   Pulse (!) 59   Temp 97.7 F (36.5 C)   Ht 5' 6.5" (1.689 m)   Wt 198 lb 12.8 oz (90.2 kg)   SpO2 97%   BMI 31.61 kg/m  Wt Readings from Last 3 Encounters:  07/01/18 198 lb 12.8 oz (90.2 kg)  03/12/18 198 lb 6.4 oz (90 kg)  08/13/17 193 lb (87.5 kg)   General Appearance: Well nourished, in no apparent distress. Eyes: PERRLA, EOMs, conjunctiva no swelling or erythema Sinuses: No Frontal/maxillary tenderness ENT/Mouth: Ext aud canals clear, TMs without erythema, bulging. No erythema, swelling, or exudate on post pharynx.  Tonsils not swollen or erythematous. Hearing normal.  Neck: Supple, thyroid normal.  Respiratory: Respiratory effort normal, BS equal bilaterally without rales, rhonchi, wheezing or stridor.  Cardio: RRR with no MRGs. Brisk peripheral pulses without edema.  Abdomen: Soft, + BS.  Non tender, no guarding, rebound, hernias, masses. Lymphatics: Non tender without lymphadenopathy.  Musculoskeletal: Full ROM, 5/5 strength, Normal gait Skin: Warm, dry without rashes, lesions, ecchymosis.  Neuro: Cranial nerves  intact. No cerebellar symptoms.  Psych: Awake and oriented X 3, normal affect, Insight and Judgment appropriate.    Dan Maker, NP 8:57 AM [facility]

## 2018-07-01 ENCOUNTER — Ambulatory Visit (INDEPENDENT_AMBULATORY_CARE_PROVIDER_SITE_OTHER): Payer: Self-pay | Admitting: Adult Health

## 2018-07-01 ENCOUNTER — Encounter: Payer: Self-pay | Admitting: Adult Health

## 2018-07-01 VITALS — BP 132/82 | HR 59 | Temp 97.7°F | Ht 66.5 in | Wt 198.8 lb

## 2018-07-01 DIAGNOSIS — E669 Obesity, unspecified: Secondary | ICD-10-CM

## 2018-07-01 DIAGNOSIS — Z79899 Other long term (current) drug therapy: Secondary | ICD-10-CM

## 2018-07-01 DIAGNOSIS — R7309 Other abnormal glucose: Secondary | ICD-10-CM

## 2018-07-01 DIAGNOSIS — E782 Mixed hyperlipidemia: Secondary | ICD-10-CM

## 2018-07-01 DIAGNOSIS — E349 Endocrine disorder, unspecified: Secondary | ICD-10-CM

## 2018-07-01 DIAGNOSIS — I10 Essential (primary) hypertension: Secondary | ICD-10-CM

## 2018-07-01 DIAGNOSIS — E559 Vitamin D deficiency, unspecified: Secondary | ICD-10-CM

## 2018-07-01 NOTE — Patient Instructions (Addendum)
Goals    . Blood Pressure < 130/80    . LDL CALC < 130    . Weight (lb) < 185 lb (83.9 kg)      Consider adding a daily soluble fiber supplement - e.g. citrucel or benefiber  Avoid processed foods - potato chips, premade pastries - are made with saturated/trans fats that cause bad cholesterol elevations because these extend shelf life   Preventing High Cholesterol Cholesterol is a waxy, fat-like substance that your body needs in small amounts. Your liver makes all the cholesterol that your body needs. Having high cholesterol (hypercholesterolemia) increases your risk for heart disease and stroke. Extra (excess) cholesterol comes from the food you eat, such as animal-based fat (saturated fat) from meat and some dairy products. High cholesterol can often be prevented with diet and lifestyle changes. If you already have high cholesterol, you can control it with diet and lifestyle changes, as well as medicine. What nutrition changes can be made?  Eat less saturated fat. Foods that contain saturated fat include red meat and some dairy products.  Avoid processed meats, like bacon and lunch meats.  Avoid trans fats, which are found in margarine and some baked goods.  Avoid foods and beverages that have added sugars.  Eat more fruits, vegetables, and whole grains.  Choose healthy sources of protein, such as fish, poultry, and nuts.  Choose healthy sources of fat, such as: ? Nuts. ? Vegetable oils, especially olive oil. ? Fish that have healthy fats (omega-3 fatty acids), such as mackerel or salmon. What lifestyle changes can be made?  Lose weight if you are overweight. Losing 5-10 lb (2.3-4.5 kg) can help prevent or control high cholesterol and reduce your risk for diabetes and high blood pressure. Ask your health care provider to help you with a diet and exercise plan to safely lose weight.  Get enough exercise. Do at least 150 minutes of moderate-intensity exercise each week. ? You  could do this in short exercise sessions several times a day, or you could do longer exercise sessions a few times a week. For example, you could take a brisk 10-minute walk or bike ride, 3 times a day, for 5 days a week.  Do not smoke. If you need help quitting, ask your health care provider.  Limit your alcohol intake. If you drink alcohol, limit alcohol intake to no more than 1 drink a day for nonpregnant women and 2 drinks a day for men. One drink equals 12 oz of beer, 5 oz of wine, or 1 oz of hard liquor. Why are these changes important? If you have high cholesterol, deposits (plaques) may build up on the walls of your blood vessels. Plaques make the arteries narrower and stiffer, which can restrict or block blood flow and cause blood clots to form. This greatly increases your risk for heart attack and stroke. Making diet and lifestyle changes can reduce your risk for these life-threatening conditions. What can I do to lower my risk?  Manage your risk factors for high cholesterol. Talk with your health care provider about all of your risk factors and how to lower your risk.  Manage other conditions that you have, such as diabetes or high blood pressure (hypertension).  Have your cholesterol checked at regular intervals.  Keep all follow-up visits as told by your health care provider. This is important. How is this treated? In addition to diet and lifestyle changes, your health care provider may recommend medicines to help lower cholesterol, such  as a medicine to reduce the amount of cholesterol made in your liver. You may need medicine if:  Diet and lifestyle changes do not lower your cholesterol enough.  You have high cholesterol and other risk factors for heart disease or stroke.  Take over-the-counter and prescription medicines only as told by your health care provider. Where to find more information:  American Heart Association:  1122334455www.heart.org/HEARTORG/Conditions/Cholesterol/Cholesterol_UCM_001089_SubHomePage.jsp  National Heart, Lung, and Blood Institute: http://hood.com/www.nhlbi.nih.gov/health/resources/heart/heart-cholesterol-hbc-what-html Summary  High cholesterol increases your risk for heart disease and stroke. By keeping your cholesterol level low, you can reduce your risk for these conditions.  Diet and lifestyle changes are the most important steps in preventing high cholesterol.  Work with your health care provider to manage your risk factors, and have your blood tested regularly. This information is not intended to replace advice given to you by your health care provider. Make sure you discuss any questions you have with your health care provider. Document Released: 10/17/2015 Document Revised: 06/10/2016 Document Reviewed: 06/10/2016 Elsevier Interactive Patient Education  [employer]2018 [employer]

## 2018-07-02 LAB — COMPLETE METABOLIC PANEL WITH GFR
AG RATIO: 2.6 (calc) — AB (ref 1.0–2.5)
ALBUMIN MSPROF: 4.7 g/dL (ref 3.6–5.1)
ALKALINE PHOSPHATASE (APISO): 53 U/L (ref 40–115)
ALT: 42 U/L (ref 9–46)
AST: 26 U/L (ref 10–35)
BILIRUBIN TOTAL: 0.7 mg/dL (ref 0.2–1.2)
BUN: 17 mg/dL (ref 7–25)
CHLORIDE: 102 mmol/L (ref 98–110)
CO2: 31 mmol/L (ref 20–32)
Calcium: 9.9 mg/dL (ref 8.6–10.3)
Creat: 1.29 mg/dL (ref 0.70–1.33)
GFR, EST AFRICAN AMERICAN: 72 mL/min/{1.73_m2} (ref >=60)
GFR, Est Non African American: 62 mL/min/{1.73_m2} (ref >=60)
GLOBULIN: 1.8 g/dL — AB (ref 1.9–3.7)
Glucose, Bld: 99 mg/dL (ref 65–99)
Potassium: 5.1 mmol/L (ref 3.5–5.3)
Sodium: 140 mmol/L (ref 135–146)
TOTAL PROTEIN: 6.5 g/dL (ref 6.1–8.1)

## 2018-07-02 LAB — CBC WITH DIFFERENTIAL/PLATELET
Basophils Absolute: 60 cells/uL (ref 0–200)
Basophils Relative: 0.9 %
Eosinophils Absolute: 121 cells/uL (ref 15–500)
Eosinophils Relative: 1.8 %
HCT: 52.5 % — ABNORMAL HIGH (ref 38.5–50.0)
Hemoglobin: 18.1 g/dL — ABNORMAL HIGH (ref 13.2–17.1)
Lymphs Abs: 1722 cells/uL (ref 850–3900)
MCH: 30.4 pg (ref 27.0–33.0)
MCHC: 34.5 g/dL (ref 32.0–36.0)
MCV: 88.1 fL (ref 80.0–100.0)
MPV: 10.6 fL (ref 7.5–12.5)
Monocytes Relative: 6.1 %
NEUTROS PCT: 65.5 %
Neutro Abs: 4389 cells/uL (ref 1500–7800)
PLATELETS: 233 10*3/uL (ref 140–400)
RBC: 5.96 10*6/uL — AB (ref 4.20–5.80)
RDW: 13.4 % (ref 11.0–15.0)
TOTAL LYMPHOCYTE: 25.7 %
WBC: 6.7 10*3/uL (ref 3.8–10.8)
WBCMIX: 409 {cells}/uL (ref 200–950)

## 2018-07-02 LAB — LIPID PANEL
CHOL/HDL RATIO: 6.6 (calc) — AB (ref <5.0)
Cholesterol: 186 mg/dL (ref <200)
HDL: 28 mg/dL — AB (ref >40)
LDL Cholesterol (Calc): 130 mg/dL (calc) — ABNORMAL HIGH
NON-HDL CHOLESTEROL (CALC): 158 mg/dL — AB (ref <130)
Triglycerides: 166 mg/dL — ABNORMAL HIGH (ref <150)

## 2018-07-02 LAB — TSH: TSH: 2.72 m[IU]/L (ref 0.40–4.50)

## 2018-10-07 ENCOUNTER — Ambulatory Visit: Payer: Self-pay | Admitting: Internal Medicine

## 2018-11-16 ENCOUNTER — Other Ambulatory Visit: Payer: Self-pay | Admitting: Adult Health

## 2018-11-16 ENCOUNTER — Other Ambulatory Visit: Payer: Self-pay | Admitting: Internal Medicine

## 2018-11-16 DIAGNOSIS — I10 Essential (primary) hypertension: Secondary | ICD-10-CM

## 2019-01-14 ENCOUNTER — Other Ambulatory Visit: Payer: Self-pay | Admitting: Adult Health

## 2019-01-14 DIAGNOSIS — I10 Essential (primary) hypertension: Secondary | ICD-10-CM

## 2019-04-02 ENCOUNTER — Encounter: Payer: Self-pay | Admitting: Internal Medicine

## 2019-05-22 ENCOUNTER — Other Ambulatory Visit: Payer: Self-pay | Admitting: Internal Medicine

## 2019-05-22 DIAGNOSIS — I10 Essential (primary) hypertension: Secondary | ICD-10-CM

## 2019-05-28 ENCOUNTER — Encounter: Payer: Self-pay | Admitting: Internal Medicine

## 2019-05-28 NOTE — Patient Instructions (Signed)

## 2019-05-28 NOTE — Progress Notes (Signed)
This very nice 58 y.o. single WM presents for evaluation and management of multiple medical co-morbidities.  Patient has been followed for HTN, HLD, Prediabetes and Vitamin D Deficiency.     HTN predates since 2005. Patient's BP has been controlled at home.  Today's BP is borderline elevated at 140/92. Patient denies any cardiac symptoms as chest pain, palpitations, shortness of breath, dizziness or ankle swelling.     Patient's hyperlipidemia is not controlled with diet and medications. Patient denies myalgias or other medication SE's. Last lipids were not at goal: Lab Results  Component Value Date   CHOL 183 05/29/2019   HDL 29 (L) 05/29/2019   LDLCALC 125 (H) 05/29/2019   TRIG 170 (H) 05/29/2019   CHOLHDL 6.3 (H) 05/29/2019      Patient has hx/o prediabetes w /Insulin Resistance (A1c 5.0% / Insulin 36 / 2015) and patient denies reactive hypoglycemic symptoms, visual blurring, diabetic polys or paresthesias. Last A1c was Normal & at goal: Lab Results  Component Value Date   HGBA1C 5.4 03/12/2018       Patient has hx/o Testosterone Deficiency & is on parenteral replacement with improved stamina and mood.       Finally, patient has history of Vitamin D Deficiency  ("23"/ 2008) and last vitamin D was at goal: Lab Results  Component Value Date   VD25OH 30 03/12/2018   Current Outpatient Medications on File Prior to Visit  Medication Sig  . aspirin 81 MG chewable tablet Chew 81 mg by mouth daily.  . bisoprolol-hydrochlorothiazide (ZIAC) 10-6.25 MG tablet Take 1 tablet Daily for BP  . Cholecalciferol (VITAMIN D PO) Take 2,000 Units by mouth. Takes 8000 units daily  . fish oil-omega-3 fatty acids 1000 MG capsule Take 1 g by mouth daily.    . Flaxseed, Linseed, (FLAXSEED OIL) 1000 MG CAPS Take by mouth daily.    Marland Kitchen lisinopril (PRINIVIL,ZESTRIL) 40 MG tablet TAKE 1/2 TO 1 TABLET BY MOUTH ONCE DAILY FOR BLOOD PRESSURE  . Magnesium 500 MG TABS Take 1 tablet by mouth daily.  . Multiple  Vitamin (MULTIVITAMIN) tablet Take 1 tablet by mouth daily.    Marland Kitchen NEEDLE, DISP, 21 G 21G X 1" MISC Use for testosterone injections, 3 ml  . testosterone cypionate (DEPOTESTOSTERONE CYPIONATE) 200 MG/ML injection INJECT 2 MLS INTRAMUSCULARLY EVERY TWO WEEKS   No current facility-administered medications on file prior to visit.    Allergies  Allergen Reactions  . Pravastatin Sodium Other (See Comments)    myalgias  . Crestor [Rosuvastatin]     Elevated CPK  . Lipitor [Atorvastatin]     Myalgias  . Tricor [Fenofibrate]     Myalgias  . Vytorin [Ezetimibe-Simvastatin]     Myalgias   Past Medical History:  Diagnosis Date  . Allergy   . Hemorrhoids   . Hyperlipidemia   . Hypertension   . Kidney stone   . Testosterone deficiency   . Vitamin D deficiency    Health Maintenance  Topic Date Due  . Hepatitis C Screening  February 06, 1961  . INFLUENZA VACCINE  05/17/2019  . COLONOSCOPY  12/11/2021  . TETANUS/TDAP  03/12/2028  . HIV Screening  Completed   Immunization History  Administered Date(s) Administered  . DT 10/19/2003  . Influenza Split 07/10/2013  . PPD Test 03/12/2018  . Pneumococcal Polysaccharide-23 03/03/2009  . Td 03/12/2018   Last Colon - 12/12/2011 - Dr Carlean Purl - Recc 10 yr f/u - due Mar 2023  Past Surgical History:  Procedure  Laterality Date  . BASAL CELL CARCINOMA EXCISION     rt. eyelid  . COLONOSCOPY  12/12/2011   Procedure: COLONOSCOPY;  Surgeon: Iva Booparl E Gessner, MD;  Location: [facility];  Service: Endoscopy;  Laterality: N/A;  with possible hemorrhoid banding  . WISDOM TOOTH EXTRACTION  1999   Family History  Problem Relation Age of Onset  . Colon cancer Neg Hx   . Esophageal cancer Neg Hx   . Stomach cancer Neg Hx   . Diabetes Mother   . Diabetes Unknown        Maternal grandmother  . Breast cancer Unknown        Maternal grandmother  . Lung cancer Father    Social History   Socioeconomic History  . Marital status: Single    Spouse name: Not  on file  . Number of children: 0  . Years of education: Not on file  . Highest education level: Not on file  Occupational History  . Occupation: Sports administratormechanic    Employer: [employer]   Tobacco Use  . Smoking status: Never Smoker  . Smokeless tobacco: Never Used  Substance and Sexual Activity  . Alcohol use: Yes    Alcohol/week: 7.0 standard drinks    Types: 7 drink(s) per week    Comment: 1 mixed drink per month  . Drug use: No  . Sexual activity: Not on file    ROS Constitutional: Denies fever, chills, weight loss/gain, headaches, insomnia,  night sweats or change in appetite. Does c/o fatigue. Eyes: Denies redness, blurred vision, diplopia, discharge, itchy or watery eyes.  ENT: Denies discharge, congestion, post nasal drip, epistaxis, sore throat, earache, hearing loss, dental pain, Tinnitus, Vertigo, Sinus pain or snoring.  Cardio: Denies chest pain, palpitations, irregular heartbeat, syncope, dyspnea, diaphoresis, orthopnea, PND, claudication or edema Respiratory: denies cough, dyspnea, DOE, pleurisy, hoarseness, laryngitis or wheezing.  Gastrointestinal: Denies dysphagia, heartburn, reflux, water brash, pain, cramps, nausea, vomiting, bloating, diarrhea, constipation, hematemesis, melena, hematochezia, jaundice or hemorrhoids Genitourinary: Denies dysuria, frequency, urgency, nocturia, hesitancy, discharge, hematuria or flank pain Musculoskeletal: Denies arthralgia, myalgia, stiffness, Jt. Swelling, pain, limp or strain/sprain. Denies Falls. Skin: Denies puritis, rash, hives, warts, acne, eczema or change in skin lesion Neuro: No weakness, tremor, incoordination, spasms, paresthesia or pain Psychiatric: Denies confusion, memory loss or sensory loss. Denies Depression. Endocrine: Denies change in weight, skin, hair change, nocturia, and paresthesia, diabetic polys, visual blurring or hyper / hypo glycemic episodes.  Heme/Lymph: No excessive bleeding, bruising or enlarged lymph  nodes.  Physical Exam  BP (!) 140/92   Pulse 60   Temp (!) 97 F (36.1 C)   Resp 16   Ht 5' 6.5" (1.689 m)   Wt 194 lb 6.4 oz (88.2 kg)   BMI 30.91 kg/m   General Appearance: Well nourished and well groomed and in no apparent distress.  Eyes: PERRLA, EOMs, conjunctiva no swelling or erythema, normal fundi and vessels. Sinuses: No frontal/maxillary tenderness ENT/Mouth: EACs patent / TMs  nl. Nares clear without erythema, swelling, mucoid exudates. Oral hygiene is good. No erythema, swelling, or exudate. Tongue normal, non-obstructing. Tonsils not swollen or erythematous. Hearing normal.  Neck: Supple, thyroid not palpable. No bruits, nodes or JVD. Respiratory: Respiratory effort normal.  BS equal and clear bilateral without rales, rhonci, wheezing or stridor. Cardio: Heart sounds are normal with regular rate and rhythm and no murmurs, rubs or gallops. Peripheral pulses are normal and equal bilaterally without edema. No aortic or femoral bruits. Chest: symmetric with normal excursions  and percussion.  Abdomen: Soft, with Nl bowel sounds. Nontender, no guarding, rebound, hernias, masses, or organomegaly.  Lymphatics: Non tender without lymphadenopathy.  Musculoskeletal: Full ROM all peripheral extremities, joint stability, 5/5 strength, and normal gait. Skin: Warm and dry without rashes, lesions, cyanosis, clubbing or  ecchymosis.  Neuro: Cranial nerves intact, reflexes equal bilaterally. Normal muscle tone, no cerebellar symptoms. Sensation intact.  Pysch: Alert and oriented X 3 with normal affect, insight and judgment appropriate.   Assessment and Plan  1. Essential hypertension  - COMPLETE METABOLIC PANEL WITH GFR  2. Hyperlipidemia, mixed  - Lipid panel  3. Prediabetes  4. Vitamin D deficiency  5. Testosterone deficiency       Patient was counseled in prudent diet, weight control to achieve/maintain BMI less than 25, BP monitoring, regular exercise and medications as  discussed.  Discussed med effects and SE's. Limited  labs done due to lack of insurance coverage. Over 20 minutes of exam, counseling, chart review and high complex critical decision making was performed   Marinus MawWilliam D Oriyah Lamphear, MD

## 2019-05-29 ENCOUNTER — Ambulatory Visit (INDEPENDENT_AMBULATORY_CARE_PROVIDER_SITE_OTHER): Payer: Self-pay | Admitting: Internal Medicine

## 2019-05-29 ENCOUNTER — Other Ambulatory Visit: Payer: Self-pay

## 2019-05-29 VITALS — BP 140/92 | HR 60 | Temp 97.0°F | Resp 16 | Ht 66.5 in | Wt 194.4 lb

## 2019-05-29 DIAGNOSIS — I10 Essential (primary) hypertension: Secondary | ICD-10-CM

## 2019-05-29 DIAGNOSIS — E559 Vitamin D deficiency, unspecified: Secondary | ICD-10-CM

## 2019-05-29 DIAGNOSIS — E349 Endocrine disorder, unspecified: Secondary | ICD-10-CM

## 2019-05-29 DIAGNOSIS — E782 Mixed hyperlipidemia: Secondary | ICD-10-CM

## 2019-05-29 DIAGNOSIS — R7303 Prediabetes: Secondary | ICD-10-CM

## 2019-05-29 MED ORDER — CITALOPRAM HYDROBROMIDE 40 MG PO TABS: ORAL_TABLET | ORAL | 1 refills | Status: DC

## 2019-05-30 LAB — COMPLETE METABOLIC PANEL WITH GFR
AG Ratio: 2 (calc) (ref 1.0–2.5)
ALT: 50 U/L — ABNORMAL HIGH (ref 9–46)
AST: 39 U/L — ABNORMAL HIGH (ref 10–35)
Albumin: 4.5 g/dL (ref 3.6–5.1)
Alkaline phosphatase (APISO): 54 U/L (ref 35–144)
BUN: 18 mg/dL (ref 7–25)
CO2: 29 mmol/L (ref 20–32)
Calcium: 10.3 mg/dL (ref 8.6–10.3)
Chloride: 102 mmol/L (ref 98–110)
Creat: 1.26 mg/dL (ref 0.70–1.33)
GFR, Est African American: 73 mL/min/{1.73_m2} (ref >=60)
GFR, Est Non African American: 63 mL/min/{1.73_m2} (ref >=60)
Globulin: 2.3 g/dL (calc) (ref 1.9–3.7)
Glucose, Bld: 74 mg/dL (ref 65–99)
Potassium: 4.7 mmol/L (ref 3.5–5.3)
Sodium: 138 mmol/L (ref 135–146)
Total Bilirubin: 0.7 mg/dL (ref 0.2–1.2)
Total Protein: 6.8 g/dL (ref 6.1–8.1)

## 2019-05-30 LAB — LIPID PANEL
Cholesterol: 183 mg/dL (ref <200)
HDL: 29 mg/dL — ABNORMAL LOW (ref >=40)
LDL Cholesterol (Calc): 125 mg/dL (calc) — ABNORMAL HIGH
Non-HDL Cholesterol (Calc): 154 mg/dL (calc) — ABNORMAL HIGH (ref <130)
Total CHOL/HDL Ratio: 6.3 (calc) — ABNORMAL HIGH (ref <5.0)
Triglycerides: 170 mg/dL — ABNORMAL HIGH (ref <150)

## 2019-05-31 ENCOUNTER — Encounter: Payer: Self-pay | Admitting: Internal Medicine

## 2019-06-02 ENCOUNTER — Other Ambulatory Visit: Payer: Self-pay | Admitting: *Deleted

## 2019-06-02 MED ORDER — CITALOPRAM HYDROBROMIDE 40 MG PO TABS: ORAL_TABLET | ORAL | 1 refills | Status: DC

## 2019-06-28 ENCOUNTER — Other Ambulatory Visit: Payer: Self-pay | Admitting: Internal Medicine

## 2019-08-19 ENCOUNTER — Other Ambulatory Visit: Payer: Self-pay | Admitting: Internal Medicine

## 2019-08-19 DIAGNOSIS — I10 Essential (primary) hypertension: Secondary | ICD-10-CM

## 2019-10-22 ENCOUNTER — Other Ambulatory Visit: Payer: Self-pay | Admitting: Adult Health

## 2019-12-05 NOTE — Progress Notes (Signed)
FOLLOW UP  Assessment and Plan:   Hypertension Above goal; however consistently at goal with home checks; labile on history Check BP at different times of the day, keep close log Defer any medication adjustments today per strong patient preference; he would like to work on lifestyle Consider adding amlodipine if needed Monitor blood pressure at home; patient to call if consistently greater than 130/80 Continue DASH diet.   Reminder to go to the ER if any CP, SOB, nausea, dizziness, severe HA, changes vision/speech, left arm numbness and tingling and jaw pain.  Cholesterol Currently above goal; hx of statin intolerance with myalgia, SE with fenofibrate, zetia,  Continue omega 3 - suggested he add soluble fiber supplement, discussed LDL goal to maintain <130 with low risk history Continue low cholesterol diet and exercise; weight loss encouraged Check lipid panel.   Other abnormal glucose Recent A1Cs at goal Discussed diet/exercise, weight management  Defer A1C; check CMP  Obesity with co morbidities Long discussion about weight loss, diet, and exercise Recommended diet heavy in fruits and veggies and low in animal meats, cheeses, and dairy products, appropriate calorie intake Discussed ideal weight for height and initial weight goal (<190lb) Make mindful choices during the holidays Will follow up in 3 months  Vitamin D Def At goal at last visit; continue supplementation to maintain goal of 60-100 Defer Vit D level  Hypogonadism - continue to monitor, states medication is helping with symptoms of low T.    Continue diet and meds as discussed. Further disposition pending results of labs. Discussed med's effects and SE's.   Over 30 minutes of exam, counseling, chart review, and critical decision making was performed.   Future Appointments  Date Time Provider Department Center  07/08/2020  3:00 PM Lucky Cowboy, MD [facility] None     ----------------------------------------------------------------------------------------------------------------------  HPI 59 y.o. male  presents for 3 month follow up on hypertension, cholesterol, glucose management, obesity, hypogonadism and vitamin D deficiency.   He is self pay today, insurance will be kicking in soon.   He reports some frustration with negative moods in people around him, was feeling down and somewhat depressed, was prescribed celexa at last visit, tried for 45 days, unsure if helped but has since made some lifestyle changes, fired 1 employee and has been focusing on cutting out negativity and feels doing fairly.   BMI is Body mass index is 32.59 kg/m., he has been working on diet, reducing red meat and dairy, works a physically intense job.  Wt Readings from Last 3 Encounters:  12/08/19 205 lb (93 kg)  05/29/19 194 lb 6.4 oz (88.2 kg)  07/01/18 198 lb 12.8 oz (90.2 kg)   His blood pressure has been controlled at home (120-128/70-80s, checks 3-4 days a week), today their BP is BP: (!) 142/88  He does not workout but works physically intense job. He denies chest pain, shortness of breath, dizziness.   He is on cholesterol medication (zetia 10 mg daily but stopped taking, cannot recall specifics but felt unwell taking, hx of intolerance of multiple statins and fenofibrate) and denies myalgias. He is on a omega 3 supplement. His cholesterol is not at goal. The cholesterol last visit was:   Lab Results  Component Value Date   CHOL 183 05/29/2019   HDL 29 (L) 05/29/2019   LDLCALC 125 (H) 05/29/2019   TRIG 170 (H) 05/29/2019   CHOLHDL 6.3 (H) 05/29/2019    He has been working on diet and exercise for glucose management, and denies  foot ulcerations, increased appetite, nausea, paresthesia of the feet, polydipsia, polyuria, visual disturbances and vomiting. Last A1C in the office was:  Lab Results  Component Value Date   HGBA1C 5.4 03/12/2018    Last GFR:  Lab  Results  Component Value Date   GFRNONAA 63 05/29/2019   Patient is on Vitamin D supplement.   Lab Results  Component Value Date   VD25OH 67 03/12/2018     He has a history of testosterone deficiency and is on testosterone replacement, taking 100 mg weekly. He states that the testosterone helps with his energy, libido, muscle mass. Lab Results  Component Value Date   TESTOSTERONE 48 (L) 03/12/2018      Current Medications:  Current Outpatient Medications on File Prior to Visit  Medication Sig  . aspirin 81 MG chewable tablet Chew 81 mg by mouth daily.  . bisoprolol-hydrochlorothiazide (ZIAC) 10-6.25 MG tablet Take 1 tablet Daily for BP  . Cholecalciferol (VITAMIN D PO) Take 2,000 Units by mouth. Takes 8000 units daily  . fish oil-omega-3 fatty acids 1000 MG capsule Take 1 g by mouth daily.    . Flaxseed, Linseed, (FLAXSEED OIL) 1000 MG CAPS Take by mouth daily.    Marland Kitchen lisinopril (ZESTRIL) 40 MG tablet Take 1 tablet Daily for BP  . Magnesium 500 MG TABS Take 1 tablet by mouth daily.  . Multiple Vitamin (MULTIVITAMIN) tablet Take 1 tablet by mouth daily.    Marland Kitchen NEEDLE, DISP, 21 G 21G X 1" MISC Use for testosterone injections, 3 ml  . testosterone cypionate (DEPOTESTOSTERONE CYPIONATE) 200 MG/ML injection INJECT 2 ML INTRAMUSCULARLY  EVERY TWO WEEKS   No current facility-administered medications on file prior to visit.     Allergies:  Allergies  Allergen Reactions  . Pravastatin Sodium Other (See Comments)    myalgias  . Crestor [Rosuvastatin]     Elevated CPK  . Lipitor [Atorvastatin]     Myalgias  . Tricor [Fenofibrate]     Myalgias  . Vytorin [Ezetimibe-Simvastatin]     Myalgias     Medical History:  Past Medical History:  Diagnosis Date  . Allergy   . Hemorrhoids   . Hyperlipidemia   . Hypertension   . Kidney stone   . Testosterone deficiency   . Vitamin D deficiency    Family history- Reviewed and unchanged Social history- Reviewed and unchanged   Review  of Systems:  Review of Systems  Constitutional: Negative for malaise/fatigue and weight loss.  HENT: Negative for hearing loss and tinnitus.   Eyes: Negative for blurred vision and double vision.  Respiratory: Negative for cough, shortness of breath and wheezing.   Cardiovascular: Negative for chest pain, palpitations, orthopnea, claudication and leg swelling.  Gastrointestinal: Negative for abdominal pain, blood in stool, constipation, diarrhea, heartburn, melena, nausea and vomiting.  Genitourinary: Negative.   Musculoskeletal: Negative for joint pain and myalgias.  Skin: Negative for rash.  Neurological: Negative for dizziness, tingling, sensory change, weakness and headaches.  Endo/Heme/Allergies: Negative for polydipsia.  Psychiatric/Behavioral: Negative.   All other systems reviewed and are negative.     Physical Exam: BP (!) 142/88   Pulse 66   Temp (!) 97.3 F (36.3 C)   Wt 205 lb (93 kg)   SpO2 96%   BMI 32.59 kg/m  Wt Readings from Last 3 Encounters:  12/08/19 205 lb (93 kg)  05/29/19 194 lb 6.4 oz (88.2 kg)  07/01/18 198 lb 12.8 oz (90.2 kg)   General Appearance: Well nourished, in no  apparent distress. Eyes: PERRLA, EOMs, conjunctiva no swelling or erythema Sinuses: No Frontal/maxillary tenderness ENT/Mouth: Ext aud canals obstructed by hair growth; no apparent cerumen, TMs cannot be visualized. Mask in place; oral exam deferred. Hearing normal.  Neck: Supple, thyroid normal.  Respiratory: Respiratory effort normal, BS equal bilaterally without rales, rhonchi, wheezing or stridor.  Cardio: RRR with no MRGs. Brisk peripheral pulses without edema.  Abdomen: Soft, + BS.  Non tender, no guarding, rebound, hernias, masses. Lymphatics: Non tender without lymphadenopathy.  Musculoskeletal: Full ROM, 5/5 strength, Normal gait Skin: Warm, dry without rashes, lesions, ecchymosis.  Neuro: Cranial nerves intact. No cerebellar symptoms.  Psych: Awake and oriented X 3,  normal affect, Insight and Judgment appropriate.    Dan Maker, NP 8:57 AM [facility]

## 2019-12-08 ENCOUNTER — Encounter: Payer: Self-pay | Admitting: Adult Health

## 2019-12-08 ENCOUNTER — Ambulatory Visit (INDEPENDENT_AMBULATORY_CARE_PROVIDER_SITE_OTHER): Payer: Self-pay | Admitting: Adult Health

## 2019-12-08 ENCOUNTER — Other Ambulatory Visit: Payer: Self-pay

## 2019-12-08 VITALS — BP 142/88 | HR 66 | Temp 97.3°F | Wt 205.0 lb

## 2019-12-08 DIAGNOSIS — E782 Mixed hyperlipidemia: Secondary | ICD-10-CM

## 2019-12-08 DIAGNOSIS — E669 Obesity, unspecified: Secondary | ICD-10-CM

## 2019-12-08 DIAGNOSIS — Z79899 Other long term (current) drug therapy: Secondary | ICD-10-CM

## 2019-12-08 DIAGNOSIS — R7309 Other abnormal glucose: Secondary | ICD-10-CM

## 2019-12-08 DIAGNOSIS — I10 Essential (primary) hypertension: Secondary | ICD-10-CM

## 2019-12-08 DIAGNOSIS — E66811 Obesity, class 1: Secondary | ICD-10-CM

## 2019-12-08 DIAGNOSIS — E559 Vitamin D deficiency, unspecified: Secondary | ICD-10-CM

## 2019-12-08 DIAGNOSIS — E349 Endocrine disorder, unspecified: Secondary | ICD-10-CM

## 2019-12-08 LAB — COMPLETE METABOLIC PANEL WITH GFR
AG Ratio: 2 (calc) (ref 1.0–2.5)
ALT: 65 U/L — ABNORMAL HIGH (ref 9–46)
AST: 37 U/L — ABNORMAL HIGH (ref 10–35)
Albumin: 4.2 g/dL (ref 3.6–5.1)
Alkaline phosphatase (APISO): 50 U/L (ref 35–144)
BUN: 14 mg/dL (ref 7–25)
CO2: 33 mmol/L — ABNORMAL HIGH (ref 20–32)
Calcium: 10.2 mg/dL (ref 8.6–10.3)
Chloride: 100 mmol/L (ref 98–110)
Creat: 1.23 mg/dL (ref 0.70–1.33)
GFR, Est African American: 76 mL/min/{1.73_m2} (ref >=60)
GFR, Est Non African American: 65 mL/min/{1.73_m2} (ref >=60)
Globulin: 2.1 g/dL (calc) (ref 1.9–3.7)
Glucose, Bld: 107 mg/dL — ABNORMAL HIGH (ref 65–99)
Potassium: 5 mmol/L (ref 3.5–5.3)
Sodium: 139 mmol/L (ref 135–146)
Total Bilirubin: 0.6 mg/dL (ref 0.2–1.2)
Total Protein: 6.3 g/dL (ref 6.1–8.1)

## 2019-12-08 LAB — LIPID PANEL
Cholesterol: 205 mg/dL — ABNORMAL HIGH (ref <200)
HDL: 27 mg/dL — ABNORMAL LOW (ref >=40)
LDL Cholesterol (Calc): 136 mg/dL (calc) — ABNORMAL HIGH
Non-HDL Cholesterol (Calc): 178 mg/dL (calc) — ABNORMAL HIGH (ref <130)
Total CHOL/HDL Ratio: 7.6 (calc) — ABNORMAL HIGH (ref <5.0)
Triglycerides: 297 mg/dL — ABNORMAL HIGH (ref <150)

## 2019-12-08 NOTE — Patient Instructions (Signed)
Goals    . Blood Pressure < 130/80    . LDL CALC < 130    . Weight (lb) < 190 lb (86.2 kg)       General weight loss tips  Drink 1/2 your body weight in fluid ounces of water daily; drink a tall glass of water 30 min before meals  Don't eat until you're stuffed- listen to your stomach and eat until you are 80% full   Try eating off of a salad plate; wait 10 min after finishing before going back for seconds  Start by eating the vegetables on your plate; aim for 50% of your meals to be fruits or vegetables  Then eat your protein - lean meats (grass fed if possible), fish, beans, nuts in moderation  Eat your carbs/starch last ONLY if you still are hungry. If you can, stop before finishing it all  Avoid sugar and flour - the closer it looks to it's original form in nature, typically the better it is for you  Splurge in moderation - "assign" days when you get to splurge and have the "bad stuff" - I like to follow a 80% - 20% plan- "good" choices 80 % of the time, "bad" choices in moderation 20% of the time  Simple equation is: Calories out > calories in = weight loss - even if you eat the bad stuff, if you limit portions, you will still lose weight         HYPERTENSION INFORMATION  Monitor your blood pressure at home, please keep a record and bring that in with you to your next office visit.   Go to the ER if any CP, SOB, nausea, dizziness, severe HA, changes vision/speech  Testing/Procedures: HOW TO TAKE YOUR BLOOD PRESSURE:  Rest 5 minutes before taking your blood pressure.  Don't smoke or drink caffeinated beverages for at least 30 minutes before.  Take your blood pressure before (not after) you eat.  Sit comfortably with your back supported and both feet on the floor (don't cross your legs).  Elevate your arm to heart level on a table or a desk.  Use the proper sized cuff. It should fit smoothly and snugly around your bare upper arm. There should be enough room to  slip a fingertip under the cuff. The bottom edge of the cuff should be 1 inch above the crease of the elbow.  There was a study that showed taking your blood pressure medications at night decrease cardiovascular events.  However if you are on a fluid pill, please take this in the morning.   Your most recent BP: BP: (!) 142/88   Take your medications faithfully as instructed. Maintain a healthy weight. Get at least 150 minutes of aerobic exercise per week. Minimize salt intake. Minimize alcohol intake  DASH Eating Plan DASH stands for "Dietary Approaches to Stop Hypertension." The DASH eating plan is a healthy eating plan that has been shown to reduce high blood pressure (hypertension). Additional health benefits may include reducing the risk of type 2 diabetes mellitus, heart disease, and stroke. The DASH eating plan may also help with weight loss. WHAT DO I NEED TO KNOW ABOUT THE DASH EATING PLAN? For the DASH eating plan, you will follow these general guidelines:  Choose foods with a percent daily value for sodium of less than 5% (as listed on the food label).  Use salt-free seasonings or herbs instead of table salt or sea salt.  Check with your health care provider or  pharmacist before using salt substitutes.  Eat lower-sodium products, often labeled as "lower sodium" or "no salt added."  Eat fresh foods.  Eat more vegetables, fruits, and low-fat dairy products.  Choose whole grains. Look for the word "whole" as the first word in the ingredient list.  Choose fish and skinless chicken or [place] more often than red meat. Limit fish, poultry, and meat to 6 oz (170 g) each day.  Limit sweets, desserts, sugars, and sugary drinks.  Choose heart-healthy fats.  Limit cheese to 1 oz (28 g) per day.  Eat more home-cooked food and less restaurant, buffet, and fast food.  Limit fried foods.  Cook foods using methods other than frying.  Limit canned vegetables. If you do use them,  rinse them well to decrease the sodium.  When eating at a restaurant, ask that your food be prepared with less salt, or no salt if possible. WHAT FOODS CAN I EAT? Seek help from a dietitian for individual calorie needs. Grains Whole grain or whole wheat bread. Brown rice. Whole grain or whole wheat pasta. Quinoa, bulgur, and whole grain cereals. Low-sodium cereals. Corn or whole wheat flour tortillas. Whole grain cornbread. Whole grain crackers. Low-sodium crackers. Vegetables Fresh or frozen vegetables (raw, steamed, roasted, or grilled). Low-sodium or reduced-sodium tomato and vegetable juices. Low-sodium or reduced-sodium tomato sauce and paste. Low-sodium or reduced-sodium canned vegetables.  Fruits All fresh, canned (in natural juice), or frozen fruits. Meat and Other Protein Products Ground beef (85% or leaner), grass-fed beef, or beef trimmed of fat. Skinless chicken or [place]. Ground chicken or [place]. Pork trimmed of fat. All fish and seafood. Eggs. Dried beans, peas, or lentils. Unsalted nuts and seeds. Unsalted canned beans. Dairy Low-fat dairy products, such as skim or 1% milk, 2% or reduced-fat cheeses, low-fat ricotta or cottage cheese, or plain low-fat yogurt. Low-sodium or reduced-sodium cheeses. Fats and Oils Tub margarines without trans fats. Light or reduced-fat mayonnaise and salad dressings (reduced sodium). Avocado. Safflower, olive, or canola oils. Natural peanut or almond butter. Other Unsalted popcorn and pretzels. The items listed above may not be a complete list of recommended foods or beverages. Contact your dietitian for more options. WHAT FOODS ARE NOT RECOMMENDED? Grains White bread. White pasta. White rice. Refined cornbread. Bagels and croissants. Crackers that contain trans fat. Vegetables Creamed or fried vegetables. Vegetables in a cheese sauce. Regular canned vegetables. Regular canned tomato sauce and paste. Regular tomato and vegetable  juices. Fruits Dried fruits. Canned fruit in light or heavy syrup. Fruit juice. Meat and Other Protein Products Fatty cuts of meat. Ribs, chicken wings, bacon, sausage, bologna, salami, chitterlings, fatback, hot dogs, bratwurst, and packaged luncheon meats. Salted nuts and seeds. Canned beans with salt. Dairy Whole or 2% milk, cream, half-and-half, and cream cheese. Whole-fat or sweetened yogurt. Full-fat cheeses or blue cheese. Nondairy creamers and whipped toppings. Processed cheese, cheese spreads, or cheese curds. Condiments Onion and garlic salt, seasoned salt, table salt, and sea salt. Canned and packaged gravies. Worcestershire sauce. Tartar sauce. Barbecue sauce. Teriyaki sauce. Soy sauce, including reduced sodium. Steak sauce. Fish sauce. Oyster sauce. Cocktail sauce. Horseradish. Ketchup and mustard. Meat flavorings and tenderizers. Bouillon cubes. Hot sauce. Tabasco sauce. Marinades. Taco seasonings. Relishes. Fats and Oils Butter, stick margarine, lard, shortening, ghee, and bacon fat. Coconut, palm kernel, or palm oils. Regular salad dressings. Other Pickles and olives. Salted popcorn and pretzels. The items listed above may not be a complete list of foods and beverages to avoid. Contact your dietitian  for more information. WHERE CAN I FIND MORE INFORMATION? National Heart, Lung, and Blood Institute: [employer] Document Released: 09/21/2011 Document Revised: 02/16/2014 Document Reviewed: 08/06/2013 [facility] Patient Information 2015 [place], [place]. This information is not intended to replace advice given to you by your health care provider. Make sure you discuss any questions you have with your health care provider.

## 2019-12-09 ENCOUNTER — Encounter: Payer: Self-pay | Admitting: Adult Health

## 2019-12-09 DIAGNOSIS — R7989 Other specified abnormal findings of blood chemistry: Secondary | ICD-10-CM | POA: Insufficient documentation

## 2020-02-02 ENCOUNTER — Other Ambulatory Visit: Payer: Self-pay | Admitting: Internal Medicine

## 2020-02-02 DIAGNOSIS — I10 Essential (primary) hypertension: Secondary | ICD-10-CM

## 2020-03-17 DIAGNOSIS — T466X5A Adverse effect of antihyperlipidemic and antiarteriosclerotic drugs, initial encounter: Secondary | ICD-10-CM | POA: Insufficient documentation

## 2020-03-17 NOTE — Progress Notes (Deleted)
FOLLOW UP  Assessment and Plan:   Hypertension Above goal; however consistently at goal with home checks; labile on history Check BP at different times of the day, keep close log Defer any medication adjustments today per strong patient preference; he would like to work on lifestyle Consider adding amlodipine if needed Monitor blood pressure at home; patient to call if consistently greater than 130/80 Continue DASH diet.   Reminder to go to the ER if any CP, SOB, nausea, dizziness, severe HA, changes vision/speech, left arm numbness and tingling and jaw pain.  Cholesterol Currently above goal; hx of statin intolerance with myalgia, SE with fenofibrate, zetia,  Continue omega 3 - suggested he add soluble fiber supplement, discussed LDL goal to maintain <130 with low risk history Continue low cholesterol diet and exercise; weight loss encouraged Check lipid panel.   Other abnormal glucose Recent A1Cs at goal Discussed diet/exercise, weight management  Defer A1C; check CMP  Obesity with co morbidities Long discussion about weight loss, diet, and exercise Recommended diet heavy in fruits and veggies and low in animal meats, cheeses, and dairy products, appropriate calorie intake Discussed ideal weight for height and initial weight goal (<190lb) Make mindful choices during the holidays Will follow up in 3 months  Vitamin D Def At goal at last visit; continue supplementation to maintain goal of 60-100 Defer Vit D level  Hypogonadism - continue to monitor, states medication is helping with symptoms of low T.   LFT elevation Mild intermittent; likely fatty liver *** alcohol  Check labs, avoid tylenol, alcohol, weight loss advised.  Needs hepatitis panel and imaging, deferred due to current uninsured status   Continue diet and meds as discussed. Further disposition pending results of labs. Discussed med's effects and SE's.   Over 30 minutes of exam, counseling, chart review, and  critical decision making was performed.   Future Appointments  Date Time Provider [place]  03/18/2020  9:30 AM Sean Comber, NP [facility] None  07/08/2020  3:00 PM Sean Pinto, MD [facility] None    ----------------------------------------------------------------------------------------------------------------------  HPI 59 y.o. male  presents for 3 month follow up on hypertension, cholesterol, glucose management, obesity, hypogonadism and vitamin D deficiency.   He is self pay today, insurance will be kicking in soon. ***  He reports some frustration with negative moods in people around him, was feeling down and somewhat depressed, has since made some lifestyle changes, fired 1 employee and has been focusing on cutting out negativity and feels doing fairly. Didn't note benefit with celexa.   BMI is There is no height or weight on file to calculate BMI., he has been working on diet, reducing red meat and dairy, works a physically intense job.  Wt Readings from Last 3 Encounters:  12/08/19 205 lb (93 kg)  05/29/19 194 lb 6.4 oz (88.2 kg)  07/01/18 198 lb 12.8 oz (90.2 kg)   His blood pressure has been controlled at home (120-128/70-80s, checks 3-4 days a week), today their BP is    He does not workout but works physically intense job. He denies chest pain, shortness of breath, dizziness.   He is on cholesterol medication (zetia 10 mg daily but stopped taking, cannot recall specifics but felt unwell taking, hx of intolerance of multiple statins and fenofibrate) and denies myalgias. He is on a omega 3 supplement. His cholesterol is not at goal. The cholesterol last visit was:   Lab Results  Component Value Date   CHOL 205 (H) 12/08/2019   HDL  27 (L) 12/08/2019   LDLCALC 136 (H) 12/08/2019   TRIG 297 (H) 12/08/2019   CHOLHDL 7.6 (H) 12/08/2019    He has been working on diet and exercise for glucose management, and denies foot ulcerations, increased appetite, nausea,  paresthesia of the feet, polydipsia, polyuria, visual disturbances and vomiting. Last A1C in the office was:  Lab Results  Component Value Date   HGBA1C 5.4 03/12/2018    Last GFR:  Lab Results  Component Value Date   GFRNONAA 65 12/08/2019   Patient is on Vitamin D supplement.   Lab Results  Component Value Date   VD25OH 31 03/12/2018     He has a history of testosterone deficiency and is on testosterone replacement, taking 100 mg weekly. He states that the testosterone helps with his energy, libido, muscle mass. Lab Results  Component Value Date   TESTOSTERONE 48 (L) 03/12/2018      Current Medications:  Current Outpatient Medications on File Prior to Visit  Medication Sig   aspirin 81 MG chewable tablet Chew 81 mg by mouth daily.   bisoprolol-hydrochlorothiazide (ZIAC) 10-6.25 MG tablet Take 1 tablet Daily for BP   Cholecalciferol (VITAMIN D PO) Take 2,000 Units by mouth. Takes 8000 units daily   fish oil-omega-3 fatty acids 1000 MG capsule Take 1 g by mouth daily.     Flaxseed, Linseed, (FLAXSEED OIL) 1000 MG CAPS Take by mouth daily.     lisinopril (ZESTRIL) 40 MG tablet Take 1 tablet Daily for BP   Magnesium 500 MG TABS Take 1 tablet by mouth daily.   Multiple Vitamin (MULTIVITAMIN) tablet Take 1 tablet by mouth daily.     NEEDLE, DISP, 21 G 21G X 1" MISC Use for testosterone injections, 3 ml   testosterone cypionate (DEPOTESTOSTERONE CYPIONATE) 200 MG/ML injection INJECT 2 ML INTRAMUSCULARLY  EVERY TWO WEEKS   No current facility-administered medications on file prior to visit.     Allergies:  Allergies  Allergen Reactions   Pravastatin Sodium Other (See Comments)    myalgias   Crestor [Rosuvastatin]     Elevated CPK   Lipitor [Atorvastatin]     Myalgias   Tricor [Fenofibrate]     Myalgias   Vytorin [Ezetimibe-Simvastatin]     Myalgias     Medical History:  Past Medical History:  Diagnosis Date   Allergy    Hemorrhoids     Hyperlipidemia    Hypertension    Kidney stone    Testosterone deficiency    Vitamin D deficiency    Family history- Reviewed and unchanged Social history- Reviewed and unchanged   Review of Systems:  Review of Systems  Constitutional: Negative for malaise/fatigue and weight loss.  HENT: Negative for hearing loss and tinnitus.   Eyes: Negative for blurred vision and double vision.  Respiratory: Negative for cough, shortness of breath and wheezing.   Cardiovascular: Negative for chest pain, palpitations, orthopnea, claudication and leg swelling.  Gastrointestinal: Negative for abdominal pain, blood in stool, constipation, diarrhea, heartburn, melena, nausea and vomiting.  Genitourinary: Negative.   Musculoskeletal: Negative for joint pain and myalgias.  Skin: Negative for rash.  Neurological: Negative for dizziness, tingling, sensory change, weakness and headaches.  Endo/Heme/Allergies: Negative for polydipsia.  Psychiatric/Behavioral: Negative.   All other systems reviewed and are negative.     Physical Exam: There were no vitals taken for this visit. Wt Readings from Last 3 Encounters:  12/08/19 205 lb (93 kg)  05/29/19 194 lb 6.4 oz (88.2 kg)  07/01/18 198  lb 12.8 oz (90.2 kg)   General Appearance: Well nourished, in no apparent distress. Eyes: PERRLA, EOMs, conjunctiva no swelling or erythema Sinuses: No Frontal/maxillary tenderness ENT/Mouth: Ext aud canals obstructed by hair growth; no apparent cerumen, TMs cannot be visualized. Mask in place; oral exam deferred. Hearing normal.  Neck: Supple, thyroid normal.  Respiratory: Respiratory effort normal, BS equal bilaterally without rales, rhonchi, wheezing or stridor.  Cardio: RRR with no MRGs. Brisk peripheral pulses without edema.  Abdomen: Soft, + BS.  Non tender, no guarding, rebound, hernias, masses. Lymphatics: Non tender without lymphadenopathy.  Musculoskeletal: Full ROM, 5/5 strength, Normal gait Skin:  Warm, dry without rashes, lesions, ecchymosis.  Neuro: Cranial nerves intact. No cerebellar symptoms.  Psych: Awake and oriented X 3, normal affect, Insight and Judgment appropriate.    Dan Maker, NP 8:22 AM [facility]

## 2020-03-18 ENCOUNTER — Ambulatory Visit: Payer: Self-pay | Admitting: Adult Health

## 2020-04-06 ENCOUNTER — Encounter: Payer: Self-pay | Admitting: Internal Medicine

## 2020-05-17 ENCOUNTER — Other Ambulatory Visit: Payer: Self-pay | Admitting: Internal Medicine

## 2020-05-17 DIAGNOSIS — I10 Essential (primary) hypertension: Secondary | ICD-10-CM

## 2020-07-08 ENCOUNTER — Encounter: Payer: Self-pay | Admitting: Internal Medicine

## 2020-07-14 ENCOUNTER — Other Ambulatory Visit: Payer: Self-pay | Admitting: Internal Medicine

## 2020-07-14 DIAGNOSIS — I10 Essential (primary) hypertension: Secondary | ICD-10-CM

## 2020-08-16 ENCOUNTER — Other Ambulatory Visit: Payer: Self-pay | Admitting: Internal Medicine

## 2020-08-16 DIAGNOSIS — I10 Essential (primary) hypertension: Secondary | ICD-10-CM

## 2020-08-25 ENCOUNTER — Encounter: Payer: Self-pay | Admitting: Internal Medicine

## 2020-08-25 NOTE — Patient Instructions (Signed)

## 2020-08-25 NOTE — Progress Notes (Addendum)
This very nice 59 y.o. single WM  has been followed for labile HTN, HLD, Prediabetes and Vitamin D Deficiency.     HTN predates circa 2005. Patient's BP has been controlled at home.  Today's BP is at goal - 122/80. Patient denies any cardiac symptoms as chest pain, palpitations, shortness of breath, dizziness or ankle swelling.      Patient's hyperlipidemia is NOT controlled with diet. Last lipids were not at goal:  Lab Results  Component Value Date   CHOL 205 (H) 12/08/2019   HDL 27 (L) 12/08/2019   LDLCALC 136 (H) 12/08/2019   TRIG 297 (H) 12/08/2019   CHOLHDL 7.6 (H) 12/08/2019        Patient has hx/o  prediabetes w /Insulin Resistance (A1c 5.0% /Insulin 36 /2015) and patient denies reactive hypoglycemic symptoms, visual blurring, diabetic polys or paresthesias. Last A1c was Normal & at goal:   Lab Results  Component Value Date   HGBA1C 5.4 03/12/2018          Patient has hx/o Testosterone Deficiency &is on parenteral replacement with improved stamina and mood.  Finally, patient has history of Vitamin D Deficiency("23"/2008) and last vitamin D was at goal:   Lab Results  Component Value Date   VD25OH 37 03/12/2018    Current Outpatient Medications on File Prior to Visit  Medication Sig  . aspirin 81 MG  tablet Takes daily.  . bisoprolol-hctz 10-6.25 MG  Take  1 tablet Daily f  . VITAMIN D  Takes 8000 units daily  . fish oil-omega-3  1000 MG  Take 1 g  daily.    Marland Kitchen FLAXSEED OIL 1000 MG  Take  daily.    Marland Kitchen lisinopril  40 MG tablet Take      1 tablet      Daily       for BP  . Magnesium 500 MG TABS Take 1 tablet daily.  . Multiple Vitamin  Take 1 tablet daily.    Marland Kitchen testosterone cypio 200 MG/ML injection INJECT 2 ML IM EVERY 2  WKS     Allergies  Allergen Reactions  . Pravastatin Sodium myalgias  . Crestor [Rosuvastatin] Elevated CPK  . Lipitor [Atorvastatin] Myalgias  . Tricor [Fenofibrate] Myalgias  . Vytorin [Ezetimibe-Simvastatin] Myalgias    Past  Medical History:  Diagnosis Date  . Allergy   . Hemorrhoids   . Hyperlipidemia   . Hypertension   . Kidney stone   . Testosterone deficiency   . Vitamin D deficiency    Health Maintenance  Topic Date Due  . Hepatitis C Screening  Never done  . COVID-19 Vaccine (1) Never done  . INFLUENZA VACCINE  05/16/2020  . COLONOSCOPY  12/11/2021  . TETANUS/TDAP  03/12/2028  . HIV Screening  Completed   Immunization History  Administered Date(s) Administered  . DT (Pediatric) 10/19/2003  . Influenza Split 07/10/2013  . PPD Test 03/12/2018  . Pneumococcal Polysaccharide-23 03/03/2009  . Td 03/12/2018   Last Colon - 12/12/2011 - Dr Leone Payor - Recc 10 yr f/u - due Mar 2023  Past Surgical History:  Procedure Laterality Date  . BASAL CELL CARCINOMA EXCISION     rt. eyelid  . COLONOSCOPY  12/12/2011   Procedure: COLONOSCOPY;  Surgeon: Iva Boop, MD;  Location: [facility];  Service: Endoscopy;  Laterality: N/A;  with possible hemorrhoid banding  . WISDOM TOOTH EXTRACTION  1999   Family History  Problem Relation Age of Onset  . Colon  cancer Neg Hx   . Esophageal cancer Neg Hx   . Stomach cancer Neg Hx   . Diabetes Mother   . Diabetes Unknown        Maternal grandmother  . Breast cancer Unknown        Maternal grandmother  . Lung cancer Father    Social History   Socioeconomic History  . Marital status: Single  . Number of children: 0  Occupational History  . 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 Smoking status: Never Smoker  . Smokeless tobacco: Never Used  Substance and Sexual Activity  . Alcohol use: Yes    Alcohol/week: 7.0 standard drinks    Types: 7 drink(s) per week    Comment: 1 mixed drink per month  . Drug use: No  . Sexual activity: Not on file     ROS Constitutional: Denies fever, chills, weight loss/gain, headaches, insomnia,  night sweats or change in appetite. Does c/o fatigue. Eyes: Denies redness, blurred vision,  diplopia, discharge, itchy or watery eyes.  ENT: Denies discharge, congestion, post nasal drip, epistaxis, sore throat, earache, hearing loss, dental pain, Tinnitus, Vertigo, Sinus pain or snoring.  Cardio: Denies chest pain, palpitations, irregular heartbeat, syncope, dyspnea, diaphoresis, orthopnea, PND, claudication or edema Respiratory: denies cough, dyspnea, DOE, pleurisy, hoarseness, laryngitis or wheezing.  Gastrointestinal: Denies dysphagia, heartburn, reflux, water brash, pain, cramps, nausea, vomiting, bloating, diarrhea, constipation, hematemesis, melena, hematochezia, jaundice or hemorrhoids Genitourinary: Denies dysuria, frequency, urgency, nocturia, hesitancy, discharge, hematuria or flank pain Musculoskeletal: Denies arthralgia, myalgia, stiffness, Jt. Swelling, pain, limp or strain/sprain. Denies Falls. Skin: Denies puritis, rash, hives, warts, acne, eczema or change in skin lesion Neuro: No weakness, tremor, incoordination, spasms, paresthesia or pain Psychiatric: Denies confusion, memory loss or sensory loss. Denies Depression. Endocrine: Denies change in weight, skin, hair change, nocturia, and paresthesia, diabetic polys, visual blurring or hyper / hypo glycemic episodes.  Heme/Lymph: No excessive bleeding, bruising or enlarged lymph nodes.  Physical Exam  BP 122/80   Pulse 60   Temp 97.8 F (36.6 C)   Resp 16   Ht 5' 6.5" (1.689 m)   Wt 202 lb 3.2 oz (91.7 kg)   SpO2 97%   BMI 32.15 kg/m   General Appearance: over nourished and well groomed and in no apparent distress.  Eyes: PERRLA, EOMs, conjunctiva no swelling or erythema, normal fundi and vessels. Sinuses: No frontal/maxillary tenderness ENT/Mouth: EACs patent / TMs  nl. Nares clear without erythema, swelling, mucoid exudates. Oral hygiene is good. No erythema, swelling, or exudate. Tongue normal, non-obstructing. Tonsils not swollen or erythematous. Hearing normal.  Neck: Supple, thyroid not palpable. No  bruits, nodes or JVD. Respiratory: Respiratory effort normal.  BS equal and clear bilateral without rales, rhonci, wheezing or stridor. Cardio: Heart sounds are normal with regular rate and rhythm and no murmurs, rubs or gallops. Peripheral pulses are normal and equal bilaterally without edema. No aortic or femoral bruits. Chest: symmetric with normal excursions and percussion.  Abdomen: Soft, with Nl bowel sounds. Nontender, no guarding, rebound, hernias, masses, or organomegaly.  Lymphatics: Non tender without lymphadenopathy.  Musculoskeletal: Full ROM all peripheral extremities, joint stability, 5/5 strength, and normal gait. Skin: Warm and dry without rashes, lesions, cyanosis, clubbing or  ecchymosis.  Neuro: Cranial nerves intact, reflexes equal bilaterally. Normal muscle tone, no cerebellar symptoms. Sensation intact.  Pysch: Alert and oriented X 3 with normal affect, insight and judgment appropriate.   Assessment and Plan  1. Essential hypertension  - COMPLETE METABOLIC PANEL WITH GFR  2. Hyperlipidemia, mixed  - Lipid panel  3. Abnormal glucose   4. Vitamin D deficiency  - VITAMIN D 25 Hydroxy   5. Testosterone deficiency   6. Fatigue, unspecified type  - Vitamin B12  7. Medication management  - COMPLETE METABOLIC PANEL WITH GFR - Lipid panel - VITAMIN D 25 Hydroxy          Patient was counseled in prudent diet, weight control to achieve/maintain BMI less than 25, BP monitoring, regular exercise and medications as discussed.  Discussed med effects and SE's. Routine screening labs and tests as requested with regular follow-up as recommended. Over of exam, counseling, chart review and  critical decision making was performed   Marinus Maw, MD

## 2020-08-26 ENCOUNTER — Other Ambulatory Visit: Payer: Self-pay

## 2020-08-26 ENCOUNTER — Ambulatory Visit (INDEPENDENT_AMBULATORY_CARE_PROVIDER_SITE_OTHER): Payer: Self-pay | Admitting: Internal Medicine

## 2020-08-26 VITALS — BP 122/80 | HR 60 | Temp 97.8°F | Resp 16 | Ht 66.5 in | Wt 202.2 lb

## 2020-08-26 DIAGNOSIS — Z0001 Encounter for general adult medical examination with abnormal findings: Secondary | ICD-10-CM

## 2020-08-26 DIAGNOSIS — Z111 Encounter for screening for respiratory tuberculosis: Secondary | ICD-10-CM

## 2020-08-26 DIAGNOSIS — Z79899 Other long term (current) drug therapy: Secondary | ICD-10-CM

## 2020-08-26 DIAGNOSIS — R7303 Prediabetes: Secondary | ICD-10-CM

## 2020-08-26 DIAGNOSIS — Z136 Encounter for screening for cardiovascular disorders: Secondary | ICD-10-CM

## 2020-08-26 DIAGNOSIS — E559 Vitamin D deficiency, unspecified: Secondary | ICD-10-CM

## 2020-08-26 DIAGNOSIS — Z1211 Encounter for screening for malignant neoplasm of colon: Secondary | ICD-10-CM

## 2020-08-26 DIAGNOSIS — R5383 Other fatigue: Secondary | ICD-10-CM | POA: Insufficient documentation

## 2020-08-26 DIAGNOSIS — E782 Mixed hyperlipidemia: Secondary | ICD-10-CM

## 2020-08-26 DIAGNOSIS — Z125 Encounter for screening for malignant neoplasm of prostate: Secondary | ICD-10-CM

## 2020-08-26 DIAGNOSIS — I10 Essential (primary) hypertension: Secondary | ICD-10-CM

## 2020-08-26 DIAGNOSIS — E349 Endocrine disorder, unspecified: Secondary | ICD-10-CM

## 2020-08-26 DIAGNOSIS — R7309 Other abnormal glucose: Secondary | ICD-10-CM

## 2020-08-27 LAB — LIPID PANEL
Cholesterol: 205 mg/dL — ABNORMAL HIGH (ref <200)
HDL: 28 mg/dL — ABNORMAL LOW (ref >=40)
LDL Cholesterol (Calc): 131 mg/dL (calc) — ABNORMAL HIGH
Non-HDL Cholesterol (Calc): 177 mg/dL (calc) — ABNORMAL HIGH (ref <130)
Total CHOL/HDL Ratio: 7.3 (calc) — ABNORMAL HIGH (ref <5.0)
Triglycerides: 319 mg/dL — ABNORMAL HIGH (ref <150)

## 2020-08-27 LAB — COMPLETE METABOLIC PANEL WITH GFR
AG Ratio: 2.4 (calc) (ref 1.0–2.5)
ALT: 56 U/L — ABNORMAL HIGH (ref 9–46)
AST: 37 U/L — ABNORMAL HIGH (ref 10–35)
Albumin: 4.5 g/dL (ref 3.6–5.1)
Alkaline phosphatase (APISO): 56 U/L (ref 35–144)
BUN: 18 mg/dL (ref 7–25)
CO2: 32 mmol/L (ref 20–32)
Calcium: 10.5 mg/dL — ABNORMAL HIGH (ref 8.6–10.3)
Chloride: 98 mmol/L (ref 98–110)
Creat: 1.21 mg/dL (ref 0.70–1.33)
GFR, Est African American: 77 mL/min/{1.73_m2} (ref >=60)
GFR, Est Non African American: 66 mL/min/{1.73_m2} (ref >=60)
Globulin: 1.9 g/dL (calc) (ref 1.9–3.7)
Glucose, Bld: 76 mg/dL (ref 65–99)
Potassium: 4.8 mmol/L (ref 3.5–5.3)
Sodium: 138 mmol/L (ref 135–146)
Total Bilirubin: 0.6 mg/dL (ref 0.2–1.2)
Total Protein: 6.4 g/dL (ref 6.1–8.1)

## 2020-08-28 ENCOUNTER — Other Ambulatory Visit: Payer: Self-pay | Admitting: Internal Medicine

## 2020-08-28 DIAGNOSIS — E782 Mixed hyperlipidemia: Secondary | ICD-10-CM

## 2020-08-28 MED ORDER — EZETIMIBE 10 MG PO TABS: ORAL_TABLET | ORAL | 0 refills | Status: DC

## 2020-08-28 NOTE — Progress Notes (Signed)
========================================================== ==========================================================  -    Liver enzymes returned slightly elevated - So Avoid Alcohol  ==========================================================  -  Blood sugar -Kidneys - Electrolytes - all  Normal / OK ==========================================================   - Total Chol = still 205 - elevated  (Ideal or Goal is less than 180)  - Bad / Dangerous LDL Chol = 131  (Ideal or Goal is less than 70  !  )   - So sent in a new Rx for Zetia (Ezetimibe)  - which is not a statin   Please schedule a NV in 3 months to recheck Lipid & CMET  ===========================================================  - Also, Diet is extremely important to help lower Chol and prevent   Heart Attack, Stroke, Blindness, Kidney Failure, Gangrene of leg or vascular Dementia  ===========================================================  - Cholesterol only comes from animal sources  - ie. meat, dairy, egg yolks  - Eat all the vegetables you want.  - Avoid meat, especially red meat - Beef AND Pork .  - Avoid cheese & dairy - milk & ice cream.     - Cheese is the most concentrated form of trans-fats which  is the worst thing to clog up our arteries.   - Veggie cheese is OK which can be found in the fresh  produce section at [facility] or [employer] or [employer] ========================================================== ==========================================================

## 2020-11-17 ENCOUNTER — Other Ambulatory Visit: Payer: Self-pay | Admitting: Internal Medicine

## 2020-11-17 DIAGNOSIS — I10 Essential (primary) hypertension: Secondary | ICD-10-CM

## 2020-11-29 ENCOUNTER — Other Ambulatory Visit: Payer: Self-pay

## 2020-11-29 ENCOUNTER — Ambulatory Visit (INDEPENDENT_AMBULATORY_CARE_PROVIDER_SITE_OTHER): Payer: Self-pay | Admitting: Adult Health Nurse Practitioner

## 2020-11-29 ENCOUNTER — Encounter: Payer: Self-pay | Admitting: Adult Health Nurse Practitioner

## 2020-11-29 VITALS — BP 122/80 | HR 64 | Temp 97.1°F | Ht 66.0 in | Wt 206.0 lb

## 2020-11-29 DIAGNOSIS — I10 Essential (primary) hypertension: Secondary | ICD-10-CM

## 2020-11-29 DIAGNOSIS — Z79899 Other long term (current) drug therapy: Secondary | ICD-10-CM

## 2020-11-29 DIAGNOSIS — E66811 Obesity, class 1: Secondary | ICD-10-CM

## 2020-11-29 DIAGNOSIS — R7309 Other abnormal glucose: Secondary | ICD-10-CM

## 2020-11-29 DIAGNOSIS — E349 Endocrine disorder, unspecified: Secondary | ICD-10-CM

## 2020-11-29 DIAGNOSIS — M25511 Pain in right shoulder: Secondary | ICD-10-CM

## 2020-11-29 DIAGNOSIS — E782 Mixed hyperlipidemia: Secondary | ICD-10-CM

## 2020-11-29 DIAGNOSIS — E669 Obesity, unspecified: Secondary | ICD-10-CM

## 2020-11-29 DIAGNOSIS — E559 Vitamin D deficiency, unspecified: Secondary | ICD-10-CM

## 2020-11-29 MED ORDER — MELOXICAM 15 MG PO TABS: 15.0000 mg | ORAL_TABLET | Freq: Every day | ORAL | 0 refills | Status: AC

## 2020-11-29 NOTE — Progress Notes (Signed)
-Self Pay FOLLOW UP 3 MONTH   Assessment and Plan:   Hypertension Above goal; however consistently at goal with home checks; labile on history Taking Bisprolol HCTZ 10/6.25mg  daily in am and lisinopril 40mg   Defer any medication adjustments today per strong patient preference; he would like to work on lifestyle Consider adding amlodipine if needed Monitor blood pressure at home; patient to call if consistently greater than 130/80 Continue DASH diet.   Reminder to go to the ER if any CP, SOB, nausea, dizziness, severe HA, changes vision/speech, left arm numbness and tingling and jaw pain.  Cholesterol Currently above goal; hx of statin intolerance with myalgia, SE with fenofibrate,  Continue zetia,  Continue omega 3 - suggested he add soluble fiber supplement, discussed LDL goal to maintain <130 with low risk history Continue low cholesterol diet and exercise; weight loss encouraged Check lipid panel.   Other abnormal glucose Recent A1Cs at goal Discussed diet/exercise, weight management  Defer A1C; check CMP  Obesity with co morbidities BMI 33 Long discussion about weight loss, diet, and exercise Recommended diet heavy in fruits and veggies and low in animal meats, cheeses, and dairy products, appropriate calorie intake Discussed ideal weight for height and initial weight goal (<190lb) Make mindful choices during the holidays Will follow up in 3 months  Vitamin D Def At goal at last visit; continue supplementation to maintain goal of 60-100 Defer Vit D level  Hypogonadism - continue to monitor, states medication is helping with symptoms of low T.   Acute pain of right shoulder -Meloxicam 15mg  daily for two weeks Discussed icing the area, avoid heat at this time. If no improvement consider prednisone taper Discussed Ortho referral if not improved as well.  Contact office with any new or worsening symptoms.   Continue diet and meds as discussed. Further disposition  pending results of labs. Discussed med's effects and SE's.   Over 30 minutes of exam, counseling, chart review, and critical decision making was performed.   Future Appointments  Date Time Provider Department Center  09/12/2021 10:00 AM , MD [facility] None    ----------------------------------------------------------------------------------------------------------------------   HPI 60 y.o. male  presents for 3 month follow up on hypertension, cholesterol, glucose management, obesity, hypogonadism and vitamin D deficiency.   Reports that he is having pain and decrease mobility on the right. 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  He does have tingling related to carpal tunnel on bilateral hands.   He is self pay today, insurance will be kicking in soon. Denies any popping or cracking in the shoulder.  Denies any trauma or event.  Reports it started about 4 weeks ago and progressively worse.  Report it is better if he does not use the arm.     BMI is Body mass index is 33.25 kg/m., he has been working on diet, reducing red meat and dairy, works a physically intense job.  Wt Readings from Last 3 Encounters:  11/29/20 206 lb (93.4 kg)  08/26/20 202 lb 3.2 oz (91.7 kg)  12/08/19 205 lb (93 kg)   His blood pressure has been controlled at home (120-128/70-80s, checks 3-4 days a week), today their BP is BP: 122/80  He does not workout but works physically intense job. He denies chest pain, shortness of breath, dizziness.   He is on cholesterol medication (zetia 10 mg daily but stopped taking, cannot recall specifics but felt unwell taking,  hx of intolerance of multiple statins and fenofibrate) and denies myalgias. He is on a omega 3 supplement. His cholesterol is not at goal. The cholesterol last visit was:   Lab Results  Component Value Date   CHOL 205 (H) 08/26/2020   HDL 28 (L) 08/26/2020   LDLCALC 131  (H) 08/26/2020   TRIG 319 (H) 08/26/2020   CHOLHDL 7.3 (H) 08/26/2020    He has been working on diet and exercise for glucose management, and denies foot ulcerations, increased appetite, nausea, paresthesia of the feet, polydipsia, polyuria, visual disturbances and vomiting. Last A1C in the office was:  Lab Results  Component Value Date   HGBA1C 5.4 03/12/2018    Last GFR:  Lab Results  Component Value Date   GFRNONAA 66 08/26/2020   Patient is on Vitamin D supplement.   Lab Results  Component Value Date   VD25OH 31 03/12/2018     He has a history of testosterone deficiency and is on testosterone replacement, taking 100 mg weekly. Denies any injection site complications. He states that the testosterone helps with his energy, libido, muscle mass. Lab Results  Component Value Date   TESTOSTERONE 48 (L) 03/12/2018      Current Medications:  Current Outpatient Medications on File Prior to Visit  Medication Sig  . aspirin 81 MG chewable tablet Chew 81 mg by mouth daily.  . bisoprolol-hydrochlorothiazide (ZIAC) 10-6.25 MG tablet Take 1 tablet by mouth once daily for blood pressure  . Cholecalciferol (VITAMIN D PO) Take 2,000 Units by mouth. Takes 8000 units daily  . ezetimibe (ZETIA) 10 MG tablet Take     1 tablet     Daily       for Cholesterol  . fish oil-omega-3 fatty acids 1000 MG capsule Take 1 g by mouth daily.  . Flaxseed, Linseed, (FLAXSEED OIL) 1000 MG CAPS Take by mouth daily.  Marland Kitchen lisinopril (ZESTRIL) 40 MG tablet Take      1 tablet      Daily       for BP  . Magnesium 500 MG TABS Take 1 tablet by mouth daily.  . Multiple Vitamin (MULTIVITAMIN) tablet Take 1 tablet by mouth daily.  Marland Kitchen NEEDLE, DISP, 21 G 21G X 1" MISC Use for testosterone injections, 3 ml  . testosterone cypionate (DEPOTESTOSTERONE CYPIONATE) 200 MG/ML injection INJECT 2 ML INTRAMUSCULARLY  EVERY TWO WEEKS   No current facility-administered medications on file prior to visit.     Allergies:  Allergies   Allergen Reactions  . Pravastatin Sodium Other (See Comments)    myalgias  . Crestor [Rosuvastatin]     Elevated CPK  . Lipitor [Atorvastatin]     Myalgias  . Tricor [Fenofibrate]     Myalgias  . Vytorin [Ezetimibe-Simvastatin]     Myalgias     Medical History:  Past Medical History:  Diagnosis Date  . Allergy   . Hemorrhoids   . Hyperlipidemia   . Hypertension   . Kidney stone   . Testosterone deficiency   . Vitamin D deficiency    Family history- Reviewed and unchanged Social history- Reviewed and unchanged   Review of Systems:  Review of Systems  Constitutional: Negative for malaise/fatigue and weight loss.  HENT: Negative for hearing loss and tinnitus.   Eyes: Negative for blurred vision and double vision.  Respiratory: Negative for cough, shortness of breath and wheezing.   Cardiovascular: Negative for chest pain, palpitations, orthopnea, claudication and leg swelling.  Gastrointestinal: Negative for abdominal pain,  blood in stool, constipation, diarrhea, heartburn, melena, nausea and vomiting.  Genitourinary: Negative.   Musculoskeletal: Positive for myalgias. Negative for back pain, falls, joint pain and neck pain.       Right shoulder  Skin: Negative for rash.  Neurological: Negative for dizziness, tingling, sensory change, weakness and headaches.  Endo/Heme/Allergies: Negative for polydipsia.  Psychiatric/Behavioral: Negative.   All other systems reviewed and are negative.     Physical Exam: BP 122/80   Pulse 64   Temp (!) 97.1 F (36.2 C)   Ht 5\' 6"  (1.676 m)   Wt 206 lb (93.4 kg)   SpO2 97%   BMI 33.25 kg/m  Wt Readings from Last 3 Encounters:  11/29/20 206 lb (93.4 kg)  08/26/20 202 lb 3.2 oz (91.7 kg)  12/08/19 205 lb (93 kg)   General Appearance: Well nourished, in no apparent distress. Eyes: PERRLA, EOMs, conjunctiva no swelling or erythema Sinuses: No Frontal/maxillary tenderness ENT/Mouth: Ext aud canals obstructed by hair growth;  no apparent cerumen, TMs cannot be visualized. Mask in place; oral exam deferred. Hearing normal.  Neck: Supple, thyroid normal.  Respiratory: Respiratory effort normal, BS equal bilaterally without rales, rhonchi, wheezing or stridor.  Cardio: RRR with no MRGs. Brisk peripheral pulses without edema.  Abdomen: Soft, + BS.  Non tender, no guarding, rebound, hernias, masses. Lymphatics: Non tender without lymphadenopathy.  Musculoskeletal: Full ROM, 5/5 strength, Normal gait  Right shoulder pain, medial deltoid/pectoral at clavicle. Edema and point tenderneses. No crepitus noted to joint, no weakness, numbness or tingling.  Skin: Warm, dry without rashes, lesions, ecchymosis.  Neuro: Cranial nerves intact. No cerebellar symptoms.  Psych: Awake and oriented X 3, normal affect, Insight and Judgment appropriate.    12/10/19, NP 11:41 AM [facility]

## 2020-11-29 NOTE — Patient Instructions (Addendum)
  We are going to send in Meloxicam (Mobic) 15mg .  Take one tablet daily with food to avoid stomach upset.  This will help to reduce inflammation.  Take this for two weeks then stop.  After this you could take one on an as needed basis.  Do not take any ibuprofen while taking this medication.  You can stop your aspirin while on this medication.  If you do not have ANY improvement in the next week place call the office to let know, we may need to do a round of steroids.  Try applying ice to the area for about [place], twice a day to see if this helps also to reduce inflammation.  Be sure to place a covering between the ice and your shoulder, ie shirt or towel, don't lay on direct skin.  Ice pack, ie in a plastic bag or bag of peas will do.  You can also take Acetaminophen (tylenol) 1,000mg  three times a day to help with pain.  This is different than the Meloxicam.   Please contact the office with any new or worsening symptoms.

## 2020-11-30 LAB — COMPLETE METABOLIC PANEL WITH GFR
AG Ratio: 2.1 (calc) (ref 1.0–2.5)
ALT: 58 U/L — ABNORMAL HIGH (ref 9–46)
AST: 33 U/L (ref 10–35)
Albumin: 4.5 g/dL (ref 3.6–5.1)
Alkaline phosphatase (APISO): 61 U/L (ref 35–144)
BUN: 14 mg/dL (ref 7–25)
CO2: 30 mmol/L (ref 20–32)
Calcium: 10.2 mg/dL (ref 8.6–10.3)
Chloride: 100 mmol/L (ref 98–110)
Creat: 1.19 mg/dL (ref 0.70–1.33)
GFR, Est African American: 78 mL/min/{1.73_m2} (ref >=60)
GFR, Est Non African American: 67 mL/min/{1.73_m2} (ref >=60)
Globulin: 2.1 g/dL (calc) (ref 1.9–3.7)
Glucose, Bld: 108 mg/dL — ABNORMAL HIGH (ref 65–99)
Potassium: 4.6 mmol/L (ref 3.5–5.3)
Sodium: 136 mmol/L (ref 135–146)
Total Bilirubin: 0.5 mg/dL (ref 0.2–1.2)
Total Protein: 6.6 g/dL (ref 6.1–8.1)

## 2020-11-30 LAB — CBC WITH DIFFERENTIAL/PLATELET
Absolute Monocytes: 531 cells/uL (ref 200–950)
Basophils Absolute: 69 cells/uL (ref 0–200)
Basophils Relative: 1 %
Eosinophils Absolute: 152 cells/uL (ref 15–500)
Eosinophils Relative: 2.2 %
HCT: 53.4 % — ABNORMAL HIGH (ref 38.5–50.0)
Hemoglobin: 18.3 g/dL — ABNORMAL HIGH (ref 13.2–17.1)
Lymphs Abs: 1608 cells/uL (ref 850–3900)
MCH: 30.1 pg (ref 27.0–33.0)
MCHC: 34.3 g/dL (ref 32.0–36.0)
MCV: 87.8 fL (ref 80.0–100.0)
MPV: 10.8 fL (ref 7.5–12.5)
Monocytes Relative: 7.7 %
Neutro Abs: 4540 cells/uL (ref 1500–7800)
Neutrophils Relative %: 65.8 %
Platelets: 232 10*3/uL (ref 140–400)
RBC: 6.08 10*6/uL — ABNORMAL HIGH (ref 4.20–5.80)
RDW: 13.1 % (ref 11.0–15.0)
Total Lymphocyte: 23.3 %
WBC: 6.9 10*3/uL (ref 3.8–10.8)

## 2020-11-30 LAB — LIPID PANEL
Cholesterol: 169 mg/dL (ref <200)
HDL: 26 mg/dL — ABNORMAL LOW (ref >=40)
LDL Cholesterol (Calc): 102 mg/dL (calc) — ABNORMAL HIGH
Non-HDL Cholesterol (Calc): 143 mg/dL (calc) — ABNORMAL HIGH (ref <130)
Total CHOL/HDL Ratio: 6.5 (calc) — ABNORMAL HIGH (ref <5.0)
Triglycerides: 310 mg/dL — ABNORMAL HIGH (ref <150)

## 2020-12-20 ENCOUNTER — Other Ambulatory Visit: Payer: Self-pay | Admitting: Internal Medicine

## 2020-12-20 DIAGNOSIS — I10 Essential (primary) hypertension: Secondary | ICD-10-CM

## 2021-02-17 ENCOUNTER — Other Ambulatory Visit: Payer: Self-pay | Admitting: Adult Health Nurse Practitioner

## 2021-02-17 DIAGNOSIS — I10 Essential (primary) hypertension: Secondary | ICD-10-CM

## 2021-03-24 ENCOUNTER — Other Ambulatory Visit: Payer: Self-pay | Admitting: Adult Health Nurse Practitioner

## 2021-05-30 ENCOUNTER — Ambulatory Visit: Payer: Self-pay | Admitting: Nurse Practitioner

## 2021-05-30 ENCOUNTER — Ambulatory Visit: Payer: Self-pay | Admitting: Adult Health Nurse Practitioner

## 2021-06-03 NOTE — Progress Notes (Signed)
6 MONTH FOLLOW UP  Assessment and Plan:  Abhijot was seen today for follow-up.  Diagnoses and all orders for this visit:  Essential hypertension -     Currently taking Ziac 10/6.25mg  and Lisinopril 40 mg daily       - - continue medications, DASH diet, exercise and monitor at home. Call if greater than 130/80.    Hyperlipidemia, mixed -     COMPLETE METABOLIC PANEL WITH GFR -     Lipid panel       - Continue diet, exercise and Zetia 10 mg daily  Testosterone deficiency       - - continue to monitor, states Testosterone 100mg  1 ml IM q week is helping with symptoms of low T.   Obesity (BMI 30.0-34.9)       - Long discussion about weight loss, diet, and exercise Recommended diet heavy in fruits and veggies and low in animal meats, cheeses, and dairy products, appropriate calorie intake  Osteoarthritis of fingers of both hands       - Continue use of Mobic, ice as needed and maintaining ROM of joiints.  Osteoarthritis of first metatarsophalangeal joint       - Advised pt to use a corn pad around area on right great toe to avoid continuous pressure on area from shoe  Gastroesophageal reflux disease, unspecified whether esophagitis present       - Believe chest discomfort he was previously experiencing was most likely caused by reflux.  He is currently asymptomatic.  Advised if that does return I would like him to do 2 weeks of Prilosec and determine if this helps symptoms.         - If he develops shortness of breath, orthopnea, chest pain described as crushing or pressure, jaw pain, sweating or nausea he is to go to the ER    Continue diet and meds as discussed. Further disposition pending results of labs. Discussed med's effects and SE's.   Over 30 minutes of exam, counseling, chart review, and critical decision making was performed.   Future Appointments  Date Time Provider Department Center  11/21/2021 10:00 AM 01/19/2022, MD [facility] None     ----------------------------------------------------------------------------------------------------------------------  HPI 60 y.o. male  presents for 3 month follow up on hypertension, cholesterol, glucose management, obesity, hypogonadism and vitamin D deficiency.   He is self pay today, insurance will be kicking in soon.   He reports some frustration with negative moods in people around him, was feeling down and somewhat depressed, was prescribed celexa at last visit, tried for 45 days, unsure if helped but has since made some lifestyle changes, fired 1 employee and has been focusing on cutting out negativity and feels doing fairly.   BMI is Body mass index is 32.89 kg/m., he has been working on diet, reducing red meat and dairy, works a physically intense job.  Wt Readings from Last 3 Encounters:  06/06/21 203 lb 12.8 oz (92.4 kg)  11/29/20 206 lb (93.4 kg)  08/26/20 202 lb 3.2 oz (91.7 kg)   His blood pressure has been controlled at home (120-128/70-80s, checks 3-4 days a week), Currently taking Ziac 10/6.25mg  today their BP is BP: 140/90 BP Readings from Last 3 Encounters:  06/06/21 140/90  11/29/20 122/80  08/26/20 122/80     He does not workout but works physically intense job. He denies chest pain, shortness of breath, dizziness.  He has been noticing increased amount of pain in right great toe, lasted approimately 5  days.  Toe was red but not warm to touch or extremely painful to touch.  Does have arthritis in this toe,    He is on cholesterol medication .He had side effects with statins, fenofibrate. He is currently on Zetia 10 mg QD.  He is on a omega 3 supplement. His cholesterol is not at goal. The cholesterol last visit was:   Lab Results  Component Value Date   CHOL 169 11/29/2020   HDL 26 (L) 11/29/2020   LDLCALC 102 (H) 11/29/2020   TRIG 310 (H) 11/29/2020   CHOLHDL 6.5 (H) 11/29/2020   He has not been working on diet and exercise .  He states in the past  month he has been eating more salt and red meat, saturated fats.  Last A1C in the office was:  Lab Results  Component Value Date   HGBA1C 5.4 03/12/2018    Last GFR:  Lab Results  Component Value Date   GFRNONAA 67 11/29/2020   Patient is on Vitamin D supplement.   Lab Results  Component Value Date   VD25OH 68 03/12/2018     He has a history of testosterone deficiency and is on testosterone replacement, taking 100 mg weekly. He states that the testosterone helps with his energy, libido, muscle mass. Lab Results  Component Value Date   TESTOSTERONE 48 (L) 03/12/2018      Current Medications:  Current Outpatient Medications on File Prior to Visit  Medication Sig   aspirin 81 MG chewable tablet Chew 81 mg by mouth daily.   bisoprolol-hydrochlorothiazide (ZIAC) 10-6.25 MG tablet Take 1 tablet by mouth once daily for blood pressure   Cholecalciferol (VITAMIN D PO) Take 2,000 Units by mouth. Takes 8000 units daily   ezetimibe (ZETIA) 10 MG tablet Take  1 tablet  Daily  for Cholesterol   fish oil-omega-3 fatty acids 1000 MG capsule Take 1 g by mouth daily.   lisinopril (ZESTRIL) 40 MG tablet Take 1 tablet by mouth once daily for blood pressure   Magnesium 500 MG TABS Take 1 tablet by mouth daily.   Multiple Vitamin (MULTIVITAMIN) tablet Take 1 tablet by mouth daily.   testosterone cypionate (DEPOTESTOSTERONE CYPIONATE) 200 MG/ML injection Inject  2 ml  into muscle  Every 2 weeks   zinc gluconate 50 MG tablet Take 50 mg by mouth daily.   Flaxseed, Linseed, (FLAXSEED OIL) 1000 MG CAPS Take by mouth daily. (Patient not taking: Reported on 06/06/2021)   meloxicam (MOBIC) 15 MG tablet Take 1 tablet (15 mg total) by mouth daily. (Patient not taking: Reported on 06/06/2021)   NEEDLE, DISP, 21 G 21G X 1" MISC Use for testosterone injections, 3 ml   No current facility-administered medications on file prior to visit.     Allergies:  Allergies  Allergen Reactions   Pravastatin Sodium  Other (See Comments)    myalgias   Crestor [Rosuvastatin]     Elevated CPK   Lipitor [Atorvastatin]     Myalgias   Tricor [Fenofibrate]     Myalgias   Vytorin [Ezetimibe-Simvastatin]     Myalgias     Medical History:  Past Medical History:  Diagnosis Date   Allergy    Hemorrhoids    Hyperlipidemia    Hypertension    Kidney stone    Testosterone deficiency    Vitamin D deficiency    Family history- Reviewed and unchanged Social history- Reviewed and unchanged   Review of Systems:  Review of Systems  Constitutional:  Negative for  chills, fever, malaise/fatigue and weight loss.  HENT:  Negative for congestion, hearing loss and tinnitus.   Eyes:  Negative for blurred vision and double vision.  Respiratory:  Negative for cough, shortness of breath and wheezing.   Cardiovascular:  Positive for chest pain (describes as a discomfort which occured in morning and resolved throughout the day, lasted 5 days then stopped). Negative for palpitations, orthopnea, claudication and leg swelling.  Gastrointestinal:  Negative for abdominal pain, blood in stool, constipation, diarrhea, heartburn, melena, nausea and vomiting.  Genitourinary: Negative.   Musculoskeletal:  Positive for joint pain (fingers and toes). Negative for falls and myalgias.  Skin:  Negative for rash.  Neurological:  Negative for dizziness, tingling, tremors, sensory change, loss of consciousness, weakness and headaches.  Endo/Heme/Allergies:  Negative for polydipsia.  Psychiatric/Behavioral: Negative.  Negative for depression, memory loss and suicidal ideas.   All other systems reviewed and are negative.    Physical Exam: BP 140/90   Pulse 72   Temp (!) 97.5 F (36.4 C)   Wt 203 lb 12.8 oz (92.4 kg)   SpO2 95%   BMI 32.89 kg/m  Wt Readings from Last 3 Encounters:  06/06/21 203 lb 12.8 oz (92.4 kg)  11/29/20 206 lb (93.4 kg)  08/26/20 202 lb 3.2 oz (91.7 kg)   General Appearance: Well nourished, in no  apparent distress. Eyes: PERRLA, EOMs, conjunctiva no swelling or erythema Sinuses: No Frontal/maxillary tenderness ENT/Mouth: Ext aud canals obstructed by hair growth; no apparent cerumen, TMs cannot be visualized. Mask in place; oral exam deferred. Hearing normal.  Neck: Supple, thyroid normal.  Respiratory: Respiratory effort normal, BS equal bilaterally without rales, rhonchi, wheezing or stridor.  Cardio: RRR with no MRGs. Brisk peripheral pulses without edema.  Abdomen: Soft, + BS.  Non tender, no guarding, rebound, hernias, masses. Lymphatics: Non tender without lymphadenopathy.  Musculoskeletal: Full ROM, 5/5 strength, Normal gait Skin: Warm, dry without rashes, lesions, ecchymosis.  Neuro: Cranial nerves intact. No cerebellar symptoms.  Psych: Awake and oriented X 3, normal affect, Insight and Judgment appropriate.    Revonda Humphrey, NP 11:37 AM [facility]

## 2021-06-06 ENCOUNTER — Other Ambulatory Visit: Payer: Self-pay

## 2021-06-06 ENCOUNTER — Ambulatory Visit (INDEPENDENT_AMBULATORY_CARE_PROVIDER_SITE_OTHER): Payer: Self-pay | Admitting: Nurse Practitioner

## 2021-06-06 ENCOUNTER — Encounter: Payer: Self-pay | Admitting: Nurse Practitioner

## 2021-06-06 VITALS — BP 140/90 | HR 72 | Temp 97.5°F | Wt 203.8 lb

## 2021-06-06 DIAGNOSIS — E782 Mixed hyperlipidemia: Secondary | ICD-10-CM

## 2021-06-06 DIAGNOSIS — Z79899 Other long term (current) drug therapy: Secondary | ICD-10-CM

## 2021-06-06 DIAGNOSIS — M19042 Primary osteoarthritis, left hand: Secondary | ICD-10-CM

## 2021-06-06 DIAGNOSIS — M19079 Primary osteoarthritis, unspecified ankle and foot: Secondary | ICD-10-CM

## 2021-06-06 DIAGNOSIS — I10 Essential (primary) hypertension: Secondary | ICD-10-CM

## 2021-06-06 DIAGNOSIS — R7309 Other abnormal glucose: Secondary | ICD-10-CM

## 2021-06-06 DIAGNOSIS — E669 Obesity, unspecified: Secondary | ICD-10-CM

## 2021-06-06 DIAGNOSIS — M19041 Primary osteoarthritis, right hand: Secondary | ICD-10-CM

## 2021-06-06 DIAGNOSIS — K219 Gastro-esophageal reflux disease without esophagitis: Secondary | ICD-10-CM

## 2021-06-06 DIAGNOSIS — E349 Endocrine disorder, unspecified: Secondary | ICD-10-CM

## 2021-06-06 DIAGNOSIS — E559 Vitamin D deficiency, unspecified: Secondary | ICD-10-CM

## 2021-06-06 LAB — COMPLETE METABOLIC PANEL WITH GFR
AG Ratio: 2 (calc) (ref 1.0–2.5)
ALT: 62 U/L — ABNORMAL HIGH (ref 9–46)
AST: 38 U/L — ABNORMAL HIGH (ref 10–35)
Albumin: 4.5 g/dL (ref 3.6–5.1)
Alkaline phosphatase (APISO): 57 U/L (ref 35–144)
BUN: 11 mg/dL (ref 7–25)
CO2: 32 mmol/L (ref 20–32)
Calcium: 10.2 mg/dL (ref 8.6–10.3)
Chloride: 100 mmol/L (ref 98–110)
Creat: 1.23 mg/dL (ref 0.70–1.30)
Globulin: 2.2 g/dL (calc) (ref 1.9–3.7)
Glucose, Bld: 115 mg/dL — ABNORMAL HIGH (ref 65–99)
Potassium: 4.9 mmol/L (ref 3.5–5.3)
Sodium: 139 mmol/L (ref 135–146)
Total Bilirubin: 0.6 mg/dL (ref 0.2–1.2)
Total Protein: 6.7 g/dL (ref 6.1–8.1)
eGFR: 68 mL/min/{1.73_m2} (ref >=60)

## 2021-06-06 LAB — LIPID PANEL
Cholesterol: 164 mg/dL (ref <200)
HDL: 26 mg/dL — ABNORMAL LOW (ref >=40)
LDL Cholesterol (Calc): 95 mg/dL (calc)
Non-HDL Cholesterol (Calc): 138 mg/dL (calc) — ABNORMAL HIGH (ref <130)
Total CHOL/HDL Ratio: 6.3 (calc) — ABNORMAL HIGH (ref <5.0)
Triglycerides: 323 mg/dL — ABNORMAL HIGH (ref <150)

## 2021-06-06 NOTE — Patient Instructions (Signed)

## 2021-06-27 ENCOUNTER — Other Ambulatory Visit: Payer: Self-pay | Admitting: Adult Health Nurse Practitioner

## 2021-06-27 DIAGNOSIS — I10 Essential (primary) hypertension: Secondary | ICD-10-CM

## 2021-07-13 ENCOUNTER — Encounter: Payer: Self-pay | Admitting: Internal Medicine

## 2021-08-19 ENCOUNTER — Other Ambulatory Visit: Payer: Self-pay | Admitting: Adult Health

## 2021-08-19 DIAGNOSIS — I10 Essential (primary) hypertension: Secondary | ICD-10-CM

## 2021-09-12 ENCOUNTER — Encounter: Payer: Self-pay | Admitting: Internal Medicine

## 2021-09-22 ENCOUNTER — Encounter: Payer: Self-pay | Admitting: Internal Medicine

## 2021-09-22 ENCOUNTER — Ambulatory Visit (INDEPENDENT_AMBULATORY_CARE_PROVIDER_SITE_OTHER): Payer: Self-pay | Admitting: Internal Medicine

## 2021-09-22 ENCOUNTER — Other Ambulatory Visit: Payer: Self-pay

## 2021-09-22 VITALS — BP 161/97 | HR 68 | Temp 98.1°F | Resp 16 | Ht 66.0 in | Wt 205.4 lb

## 2021-09-22 DIAGNOSIS — J4520 Mild intermittent asthma, uncomplicated: Secondary | ICD-10-CM

## 2021-09-22 DIAGNOSIS — J0410 Acute tracheitis without obstruction: Secondary | ICD-10-CM

## 2021-09-22 MED ORDER — DEXAMETHASONE 4 MG PO TABS: ORAL_TABLET | ORAL | 0 refills | Status: DC

## 2021-09-22 MED ORDER — BENZONATATE 200 MG PO CAPS: ORAL_CAPSULE | ORAL | 1 refills | Status: DC

## 2021-09-22 NOTE — Progress Notes (Signed)
    Future Appointments  Date Time Provider Department  09/22/2021 11:00 AM Lucky Cowboy, MD [facility]  11/21/2021 10:00 AM Lucky Cowboy, MD [facility]    History of Present Illness:     This very nice 60 yo single WM with hx/o Hypertension presents with a 56 day prodrome of head & chest congestion, clear to yellow sputum, fever, chills. No Dyspnea. Covid & Flu tests are negative.   Medications  aspirin 81 MG chewable tablet Chew 81 mg by mouth daily.   bisoprolol-hydrochlorothiazide (ZIAC) 10-6.25 MG tablet Take 1 tablet by mouth once daily for blood pressure   Cholecalciferol (VITAMIN D PO) Take 2,000 Units by mouth. Takes 8000 units daily   ezetimibe (ZETIA) 10 MG tablet Take  1 tablet  Daily  for Cholesterol   fish oil-omega-3 fatty acids 1000 MG capsule Take 1 g by mouth daily.   lisinopril (ZESTRIL) 40 MG tablet Take 1 tablet by mouth once daily for blood pressure   Magnesium 500 MG TABS Take 1 tablet by mouth daily.   Multiple Vitamin  Take 1 tablet by mouth daily.   testosterone cypio 200 MG/ML injection Inject  2 ml  into muscle  Every 2 weeks   zinc  50 MG tablet Take 50 mg by mouth daily.    Problem list    Observations/Objective:  BP (!) 161/97   Pulse 68   Temp 98.1 F (36.7 C)   Resp 16   Ht 5\' 6"  (1.676 m)   Wt 205 lb 6.4 oz (93.2 kg)   SpO2 98%   BMI 33.15 kg/m   HEENT - EACs / TMs - Nl . N/O/P - clear.  Neck - supple.  Chest - Few scattered raled & post tussive end terminal wheezes Cor - Nl HS. RRR w/o sig MGR. PP 1(+). No edema. MS- FROM w/o deformities.  Gait Nl. Neuro -  Nl w/o focal abnormalities.   Assessment and Plan:  1. Tracheitis  - dexamethasone 4 MG tablet; Take 1 tab 3 x day - 3 days, then 2 x day - 3 days, then 1 tab daily  Dispense: 20 tablet;   - benzonatate (TESSALON) 200 MG capsule; Take 1 perle 3 x / day to prevent cough  Dispense: 30 capsule; Refill: 1  2. Mild intermittent asthma without complicationsds  - Sx -  Breztr minhaler & instrucred in use.  Follow Up Instructions:        I discussed the assessment and treatment plan with the patient. The patient was provided an opportunity to ask questions and all were answered. The patient agreed with the plan and demonstrated an understanding of the instructions.       The patient was advised to call back or seek an in-person evaluation if the symptoms worsen or if the condition fails to improve as anticipated.    , MD

## 2021-10-03 ENCOUNTER — Other Ambulatory Visit: Payer: Self-pay | Admitting: Internal Medicine

## 2021-11-17 ENCOUNTER — Other Ambulatory Visit: Payer: Self-pay | Admitting: Adult Health

## 2021-11-17 DIAGNOSIS — I10 Essential (primary) hypertension: Secondary | ICD-10-CM

## 2021-11-19 ENCOUNTER — Encounter: Payer: Self-pay | Admitting: Internal Medicine

## 2021-11-19 NOTE — Patient Instructions (Signed)

## 2021-11-19 NOTE — Progress Notes (Signed)
OV           This very nice 61 y.o. single WM presents for a OV.   Patient has no insurance coverage & prefers to defer labs as much a possible. He anticipates being hired by the city of [employer] and having insurance benefits in the near future. Patient has been followed for HTN, HLD, Prediabetes , Testosterone Deficiency and Vitamin D Deficiency.       HTN predates since  2005. Patient's BP has been controlled at home.  Today's BP is elevated  and rechecked - 160/90. Patient denies any cardiac symptoms as chest pain, palpitations, shortness of breath, dizziness or ankle swelling.       Patient's hyperlipidemia is controlled with diet and Ezetimibe.  Last lipids were at goal except elevated Trig's :   Lab Results  Component Value Date   CHOL 164 06/06/2021   HDL 26 (L) 06/06/2021   LDLCALC 95 06/06/2021   TRIG 323 (H) 06/06/2021   CHOLHDL 6.3 (H) 06/06/2021         Patient is followed for prediabetes w /Insulin Resistance (A1c 5.0% /Insulin 36 /2015) and patient denies reactive hypoglycemic symptoms, visual blurring, diabetic polys or paresthesias. Last A1c was normal & at goal :   Lab Results  Component Value Date   HGBA1C 5.4 03/12/2018                                                   Patient has hx/o Testosterone Deficiency & is on parenteral replacement every 7-10 days with improved stamina and mood.  Finally, patient has history of Vitamin D Deficiency ("23"/2008) and last vitamin D was at goal :   Lab Results  Component Value Date   VD25OH 33 03/12/2018     Current Outpatient Medications on File Prior to Visit  Medication Sig   aspirin 81 MG tablet Chew daily.   bisoprolol-hctz 10-6.25 MG tablet Take 1 tablet  Daily   VITAMIN D  Takes 8000 units daily   ezetimibe 10 MG tablet Take  1 tablet  Daily   fish oil-omega-3 fatty acids 1000 MG  Take 1 g daily.   FLAXSEED OIL 1000 MG CAPS Take daily.   lisinopril 40 MG tablet Take 1 tablet  daily    Magnesium 500  MG TABS Take 1 tablet daily.   Multiple Vitamin Take 1 tablet  daily.   testosterone cypio 200 MG/ML injection INJECT 1 ML IM  EVERY WEEK   zinc 50 MG tablet Take  daily.     Allergies  Allergen Reactions   Pravastatin Sodium Other (See Comments)    myalgias   Crestor [Rosuvastatin]     Elevated CPK   Lipitor [Atorvastatin]     Myalgias   Tricor [Fenofibrate]     Myalgias   Vytorin [Ezetimibe-Simvastatin]     Myalgias     Past Medical History:  Diagnosis Date   Allergy    Hemorrhoids    Hyperlipidemia    Hypertension    Kidney stone    Testosterone deficiency    Vitamin D deficiency      Health Maintenance  Topic Date Due   COVID-19 Vaccine (1) Never done   Hepatitis C Screening  Never done   Zoster Vaccines- Shingrix (1 of 2) Never done   INFLUENZA VACCINE  05/16/2021   COLONOSCOPY 12/11/2021   TETANUS/TDAP  03/12/2028   HIV Screening  Completed   HPV VACCINES  Aged Out     Immunization History  Administered Date(s) Administered   DT (Pediatric) 10/19/2003   Influenza Split 07/10/2013   PPD Test 03/12/2018   Pneumococcal-23 03/03/2009   Td 03/12/2018    Last Colon - 12/12/2011 - Dr Carlean Purl - Recc 10 yr f/u - due Mar 2023  Past Surgical History:  Procedure Laterality Date   BASAL CELL CARCINOMA EXCISION     rt. eyelid   COLONOSCOPY; Gatha Mayer, MD; Endoscopy  12/12/2011   WISDOM TOOTH EXTRACTION  1999     Family History  Problem Relation Age of Onset   Colon cancer Neg Hx    Esophageal cancer Neg Hx    Stomach cancer Neg Hx    Diabetes Mother    Diabetes Other        Maternal grandmother   Breast cancer Other        Maternal grandmother   Lung cancer Father      Social History   Tobacco Use   Smoking status: Never   Smokeless tobacco: Never  Substance Use Topics   Alcohol use: Yes    Alcohol/week: 7.0 standard drinks    Types: 7 drink(s) per week    Comment: 1 mixed drink per month   Drug use: No       ROS Constitutional: Denies fever, chills, weight loss/gain, headaches, insomnia,  night sweats or change in appetite. Does c/o fatigue. Eyes: Denies redness, blurred vision, diplopia, discharge, itchy or watery eyes.  ENT: Denies discharge, congestion, post nasal drip, epistaxis, sore throat, earache, hearing loss, dental pain, Tinnitus, Vertigo, Sinus pain or snoring.  Cardio: Denies chest pain, palpitations, irregular heartbeat, syncope, dyspnea, diaphoresis, orthopnea, PND, claudication or edema Respiratory: denies cough, dyspnea, DOE, pleurisy, hoarseness, laryngitis or wheezing.  Gastrointestinal: Denies dysphagia, heartburn, reflux, water brash, pain, cramps, nausea, vomiting, bloating, diarrhea, constipation, hematemesis, melena, hematochezia, jaundice or hemorrhoids Genitourinary: Denies dysuria, frequency, urgency, nocturia, hesitancy, discharge, hematuria or flank pain Musculoskeletal: Denies arthralgia, myalgia, stiffness, Jt. Swelling, pain, limp or strain/sprain. Denies Falls. Skin: Denies puritis, rash, hives, warts, acne, eczema or change in skin lesion Neuro: No weakness, tremor, incoordination, spasms, paresthesia or pain Psychiatric: Denies confusion, memory loss or sensory loss. Denies Depression. Endocrine: Denies change in weight, skin, hair change, nocturia, and paresthesia, diabetic polys, visual blurring or hyper / hypo glycemic episodes.  Heme/Lymph: No excessive bleeding, bruising or enlarged lymph nodes.   Physical Exam  BP (!) 160/90    Pulse 74    Temp 97.9 F (36.6 C)    Resp 17    Ht 5\' 6"  (1.676 m)    Wt 206 lb (93.4 kg)    SpO2 95%    BMI 33.25 kg/m   General Appearance: Over nourished and well groomed and in no apparent distress.  Eyes: PERRLA, EOMs, conjunctiva no swelling or erythema, normal fundi and vessels. Sinuses: No frontal/maxillary tenderness ENT/Mouth: EACs patent / TMs  nl. Nares clear without erythema, swelling, mucoid exudates. Oral hygiene is  good. No erythema, swelling, or exudate. Tongue normal, non-obstructing. Tonsils not swollen or erythematous. Hearing normal.  Neck: Supple, thyroid not palpable. No bruits, nodes or JVD. Respiratory: Respiratory effort normal.  BS equal and clear bilateral without rales, rhonci, wheezing or stridor. Cardio: Heart sounds are normal with regular rate and rhythm and no murmurs, rubs or gallops. Peripheral pulses are normal and  equal bilaterally without edema. No aortic or femoral bruits. Chest: symmetric with normal excursions and percussion.  Abdomen: Soft, with Nl bowel sounds. Nontender, no guarding, rebound, hernias, masses, or organomegaly.  Lymphatics: Non tender without lymphadenopathy.  Musculoskeletal: Full ROM all peripheral extremities, joint stability, 5/5 strength, and normal gait. Skin: Warm and dry without rashes, lesions, cyanosis, clubbing or  ecchymosis.  Neuro: Cranial nerves intact, reflexes equal bilaterally. Normal muscle tone, no cerebellar symptoms. Sensation intact.  Pysch: Alert and oriented X 3 with normal affect, insight and judgment appropriate.   Assessment and Plan   1. Essential hypertension  - BASIC METABOLIC PANEL WITH GFR  2. Hyperlipidemia, mixed  3. Abnormal glucose  4. Vitamin D deficiency  5. Testosterone deficiency  6. Medication management  - BASIC METABOLIC PANEL WITH GFR          Patient was counseled in prudent diet, weight control to achieve/maintain BMI less than 25, BP monitoring, regular exercise and medications as discussed.  Discussed med effects and SE's. Routine screening labs and tests were deferred by patient with regular follow-up as recommended. Over 25 minutes of exam, counseling, chart review and critical decision making was performed   Kirtland Bouchard, MD

## 2021-11-21 ENCOUNTER — Other Ambulatory Visit: Payer: Self-pay

## 2021-11-21 ENCOUNTER — Ambulatory Visit (INDEPENDENT_AMBULATORY_CARE_PROVIDER_SITE_OTHER): Payer: Managed Care, Other (non HMO) | Admitting: Internal Medicine

## 2021-11-21 ENCOUNTER — Encounter: Payer: Self-pay | Admitting: Internal Medicine

## 2021-11-21 VITALS — BP 160/90 | HR 74 | Temp 97.9°F | Resp 17 | Ht 66.0 in | Wt 206.0 lb

## 2021-11-21 DIAGNOSIS — R7309 Other abnormal glucose: Secondary | ICD-10-CM

## 2021-11-21 DIAGNOSIS — E782 Mixed hyperlipidemia: Secondary | ICD-10-CM

## 2021-11-21 DIAGNOSIS — I10 Essential (primary) hypertension: Secondary | ICD-10-CM

## 2021-11-21 DIAGNOSIS — Z79899 Other long term (current) drug therapy: Secondary | ICD-10-CM

## 2021-11-21 DIAGNOSIS — E349 Endocrine disorder, unspecified: Secondary | ICD-10-CM

## 2021-11-21 DIAGNOSIS — E559 Vitamin D deficiency, unspecified: Secondary | ICD-10-CM

## 2021-11-21 LAB — BASIC METABOLIC PANEL WITH GFR
BUN: 13 mg/dL (ref 7–25)
CO2: 31 mmol/L (ref 20–32)
Calcium: 10.6 mg/dL — ABNORMAL HIGH (ref 8.6–10.3)
Chloride: 99 mmol/L (ref 98–110)
Creat: 1.21 mg/dL (ref 0.70–1.30)
Glucose, Bld: 86 mg/dL (ref 65–99)
Potassium: 4.8 mmol/L (ref 3.5–5.3)
Sodium: 138 mmol/L (ref 135–146)
eGFR: 69 mL/min/{1.73_m2} (ref >=60)

## 2021-11-22 NOTE — Progress Notes (Signed)
=============================================================== °=============================================================== ° °-    Kidney functions - Normal .  - Glucose = 86  - Normal   - Electrolytes Normal   - All OK   - Continue meds same   =============================================================== ===============================================================

## 2021-12-11 ENCOUNTER — Other Ambulatory Visit: Payer: Self-pay | Admitting: Nurse Practitioner

## 2021-12-11 DIAGNOSIS — I10 Essential (primary) hypertension: Secondary | ICD-10-CM

## 2021-12-19 ENCOUNTER — Ambulatory Visit: Payer: Managed Care, Other (non HMO) | Admitting: Internal Medicine

## 2021-12-21 ENCOUNTER — Ambulatory Visit: Payer: Managed Care, Other (non HMO) | Admitting: Internal Medicine

## 2021-12-21 ENCOUNTER — Encounter: Payer: Self-pay | Admitting: Internal Medicine

## 2021-12-21 ENCOUNTER — Other Ambulatory Visit: Payer: Self-pay

## 2021-12-21 VITALS — BP 150/90 | HR 67 | Temp 97.9°F | Resp 16 | Ht 66.0 in | Wt 201.6 lb

## 2021-12-21 DIAGNOSIS — I10 Essential (primary) hypertension: Secondary | ICD-10-CM

## 2021-12-21 MED ORDER — OLMESARTAN MEDOXOMIL 40 MG PO TABS: ORAL_TABLET | ORAL | 3 refills | Status: DC

## 2021-12-21 NOTE — Progress Notes (Signed)
? ? ?  Future Appointments  ?Date Time Provider Department  ?12/21/2021  4:00 PM Lucky Cowboy, MD [facility]  ?05/22/2022       6 mo OV  9:30 AM Lucky Cowboy, MD [facility]  ?11/24/2022 10:00 AM Lucky Cowboy, MD [facility]  ? ? ?History of Present Illness: ? ?   Patient is a very nice 61 yo single WM with labile HTN last seen 1 month ago with elevated BP 160/90  on Lisinopril 40 & Ziac 10 for recheck  BP is noted still elevated today  ? ?Medications ? ?  testosterone cypio 200 MG/ML injection, INJECT 2 ML INTO MUSCLE EVERY 2 WEEKS. ?  bisoprolol-hctz 10-6.25 MG tablet, Take 1 tablet  Daily   ?  ezetimibe (ZETIA) 10 MG tablet, Take  1 tablet  Dail ?  lisinopril (ZESTRIL) 40 MG tablet, Take 1 tablet  daily for blood pressure ? ?  aspirin 81 MG chewable tablet, Chew  daily. ? ?  Cholecalciferol (VITAMIN D PO), Take 2,000 Units by mouth. Takes 8000 units daily ?  fish oil-omega-3 fatty acids 1000 MG capsule, Take 1 g by mouth daily. ?  Flaxseed, Linseed, (FLAXSEED OIL) 1000 MG CAPS, Take by mouth daily. ?  Magnesium 500 MG TABS, Take 1 tablet by mouth daily. ?  Multiple Vitamin (MULTIVITAMIN) tablet, Take 1 tablet by mouth daily. ?  NEEDLE, DISP, 21 G 21G X 1" MISC, Use for testosterone injections, 3 ml ?  zinc gluconate 50 MG tablet, Take 50 mg by mouth daily. ? ?Problem list ?He has Hyperlipidemia, mixed; Essential hypertension; Testosterone deficiency; Vitamin D deficiency; Abnormal glucose; Screening for AAA (aortic abdominal aneurysm); Obesity (BMI 30.0-34.9); LFT elevation; Statin myopathy; Screening-pulmonary TB; and Fatigue on their problem list. ?  ?Observations/Objective: ? ?BP  150/90   P 67   T 97.9 ?F  Resp 16   Ht 5\' 6"     Wt 201 lb 9.6 oz    SpO2 95%   BMI 32.54 kg/m?  ? ?BP rechecked at 155/98 ?HEENT - WNL. ?Neck - supple.  ?Chest - Clear equal BS. ?Cor - Nl HS. RRR w/o sig MGR. PP 1(+). No edema. ?MS- FROM w/o deformities.  Gait Nl. ?Neuro -  Nl w/o focal abnormalities. ? ? ?Assessment and  Plan: ? ?1. Essential hypertension ? ?- D/C   Lisinopril & replace with  ? ?- olmesartan (BENICAR) 40 MG tablet;  ?Take 1 tablet Daily for BP   ?Dispense: 90 tablet; Refill: 3 ? ? ?- Recc 2-3 week f/u  OV ? ?Follow Up Instructions: ?  ?    I discussed the assessment and treatment plan with the patient. The patient was provided an opportunity to ask questions and all were answered. The patient agreed with the plan and demonstrated an understanding of the instructions. ?  ?    The patient was advised to call back or seek an in-person evaluation if the symptoms worsen or if the condition fails to improve as anticipated. ? ? ? ? , MD ? ?

## 2022-01-07 ENCOUNTER — Other Ambulatory Visit: Payer: Self-pay | Admitting: Nurse Practitioner

## 2022-01-11 ENCOUNTER — Ambulatory Visit (INDEPENDENT_AMBULATORY_CARE_PROVIDER_SITE_OTHER): Payer: Managed Care, Other (non HMO) | Admitting: Internal Medicine

## 2022-01-11 ENCOUNTER — Encounter: Payer: Self-pay | Admitting: Internal Medicine

## 2022-01-11 VITALS — BP 140/90 | HR 76 | Temp 97.9°F | Resp 17 | Ht 66.0 in | Wt 202.0 lb

## 2022-01-11 DIAGNOSIS — H6123 Impacted cerumen, bilateral: Secondary | ICD-10-CM

## 2022-01-11 DIAGNOSIS — I10 Essential (primary) hypertension: Secondary | ICD-10-CM

## 2022-01-11 NOTE — Progress Notes (Addendum)
? ? ?  Future Appointments  ?Date Time Provider Department  ?01/11/2022  4:00 PM Lucky Cowboy, MD [facility]  ?05/22/2022  9:30 AM Lucky Cowboy, MD [facility]  ?11/24/2022 10:00 AM Lucky Cowboy, MD [facility]  ? ? ?History of Present Illness: ? ?   Patient is a very nice 60 to single WM who returns for BP  follow up after changing his lisinopril to Olmesartan. He re-ports random BPs  ? ? ?   Patient c/o decreased hearing in Lt> Rt ears ? ?Medications ? ?  testosterone cypio 200 MG/ML injection, Inject  1 ml  Into Muscle  every week ? ?  olmesartan (BENICAR) 40 MG tablet, Take 1 tablet Daily for BP ?  bisoprolol-hyctz10-6.25 MG tablet, Take 1 tablet  Daily   ? ?  ezetimibe (ZETIA) 10 MG tablet, Take  1 tablet  Daily  for Cholesterol ?  aspirin 81 MG chewable tablet, Chew  daily. ?  VITAMIN D  2,000 Units, Take by  Takes 8000 units daily ?  fish oil-omega-3 1000 MG capsule, Take 1 g daily. ?  FLAXSEED OIL 1000 MG CAPS, Take  daily. ?  Magnesium 500 MG TABS, Take 1 tablet  daily. ?  Multiple Vitamin  Take 1 tablet daily. ?  zinc  50 MG tablet, Take  daily. ? ?Problem list ?He has Hyperlipidemia, mixed; Essential hypertension; Testosterone deficiency; Vitamin D deficiency; Abnormal glucose; Screening for AAA (aortic abdominal aneurysm); Obesity (BMI 30.0-34.9); LFT elevation; Statin myopathy; Screening-pulmonary TB; and Fatigue on their problem list. ?  ?Observations/Objective: ? ?BP 140/90   Pulse 76   Temp 97.9 ?F (36.6 ?C)   Resp 17   Ht 5\' 6"  (1.676 m)   Wt 202 lb (91.6 kg)   SpO2 96%   BMI 32.60 kg/m?  ? ?HEENT - WNL except both EACs impacted with cerum.  ?Neck - supple.  ?Chest - Clear equal BS. ?Cor - Nl HS. RRR w/o sig MGR. PP 1(+). No edema. ?MS- FROM w/o deformities.  Gait Nl. ?Neuro -  Nl w/o focal abnormalities. ? ?Procedure(s)   ( CPT  x 2 )  ? ?After informed consent\, both ears were irrigated of large cerumen plug-type impactions  with immediate recovery of hearing.  Visualized TMs  appeared norman ? ? ?Assessment and Plan: ? ?1. Essential hypertension ? ? ?2. Hearing loss due to cerumen impaction, bilateral ? ?-  ? ?Follow Up Instructions: ? ?  ?    I discussed the assessment and treatment plan with the patient. The patient was provided an opportunity to ask questions and all were answered. The patient agreed with the plan and demonstrated an understanding of the instructions. ?  ?    The patient was advised to call back or seek an in-person evaluation if the symptoms worsen or if the condition fails to improve as anticipated. ? ? ? ?[id number], MD ? ?

## 2022-01-11 NOTE — Patient Instructions (Signed)

## 2022-02-17 ENCOUNTER — Encounter: Payer: Self-pay | Admitting: Internal Medicine

## 2022-03-02 ENCOUNTER — Other Ambulatory Visit: Payer: Self-pay | Admitting: Internal Medicine

## 2022-03-02 MED ORDER — HYDROCORTISONE (PERIANAL) 2.5 % EX CREA: TOPICAL_CREAM | CUTANEOUS | 3 refills | Status: AC

## 2022-05-21 NOTE — Progress Notes (Signed)
    Future Appointments  Date Time Provider Department  12/21/2021  4:00 PM Lucky Cowboy, MD [facility]  05/22/2022       6 mo OV  9:30 AM Lucky Cowboy, MD [facility]  11/24/2022 10:00 AM Lucky Cowboy, MD [facility]    History of Present Illness:     Patient is a very nice 61 yo single WM with labile HTN last seen 1 month ago with elevated BP 160/90  on Lisinopril 40 & Ziac 10 for recheck  BP is noted still elevated today   Medications    testosterone cypio 200 MG/ML injection, INJECT 2 ML INTO MUSCLE EVERY 2 WEEKS.   bisoprolol-hctz 10-6.25 MG tablet, Take 1 tablet  Daily     ezetimibe (ZETIA) 10 MG tablet, Take  1 tablet  Dail   lisinopril (ZESTRIL) 40 MG tablet, Take 1 tablet  daily for blood pressure    aspirin 81 MG chewable tablet, Chew  daily.     VITAMIN D . Takes 8000 units daily   fish oil-omega-3 1000 MG capsule, Take 1 g  daily.   FLAXSEED OIL 1000 MG CAPS, Take  daily.   Magnesium 500 MG TABS, Take 1 tablet  daily.   Multiple Vitamin, Take 1 tablet daily.   zinc  50 MG tablet, Take 5 daily.   Problem list He has Hyperlipidemia, mixed; Essential hypertension; Testosterone deficiency; Vitamin D deficiency; Abnormal glucose; Screening for AAA (aortic abdominal aneurysm); Obesity (BMI 30.0-34.9); LFT elevation; Statin myopathy; Screening-pulmonary TB; and Fatigue on their problem list.   Observations/Objective:  BP  150/90   P 67   T 97.9 F  Resp 16   Ht 5\' 6"     Wt 201 lb 9.6 oz    SpO2 95%   BMI 32.54 kg/m   BP rechecked at 155/98 HEENT - WNL. Neck - supple.  Chest - Clear equal BS. Cor - Nl HS. RRR w/o sig MGR. PP 1(+). No edema. MS- FROM w/o deformities.  Gait Nl. Neuro -  Nl w/o focal abnormalities.   Assessment and Plan:  1. Essential hypertension    Follow Up Instructions:       I discussed the assessment and treatment plan with the patient. The patient was provided an opportunity to ask questions and all were answered. The patient  agreed with the plan and demonstrated an understanding of the instructions.       The patient was advised to call back or seek an in-person evaluation if the symptoms worsen or if the condition fails to improve as anticipated.    , MD

## 2022-05-22 ENCOUNTER — Encounter: Payer: Self-pay | Admitting: Internal Medicine

## 2022-05-22 ENCOUNTER — Ambulatory Visit: Payer: Self-pay | Admitting: Internal Medicine

## 2022-05-22 ENCOUNTER — Ambulatory Visit: Payer: Managed Care, Other (non HMO) | Admitting: Internal Medicine

## 2022-05-22 VITALS — BP 158/90 | HR 73 | Temp 97.6°F | Resp 17 | Ht 66.0 in | Wt 208.0 lb

## 2022-05-22 DIAGNOSIS — E782 Mixed hyperlipidemia: Secondary | ICD-10-CM

## 2022-05-22 DIAGNOSIS — E349 Endocrine disorder, unspecified: Secondary | ICD-10-CM | POA: Diagnosis not present

## 2022-05-22 MED ORDER — EZETIMIBE 10 MG PO TABS: ORAL_TABLET | ORAL | 3 refills | Status: DC

## 2022-05-22 MED ORDER — TESTOSTERONE CYPIONATE 200 MG/ML IM SOLN: INTRAMUSCULAR | 1 refills | Status: DC

## 2022-10-15 ENCOUNTER — Other Ambulatory Visit: Payer: Self-pay | Admitting: Internal Medicine

## 2022-10-15 DIAGNOSIS — E349 Endocrine disorder, unspecified: Secondary | ICD-10-CM

## 2022-11-23 ENCOUNTER — Encounter: Payer: Managed Care, Other (non HMO) | Admitting: Internal Medicine

## 2022-11-24 ENCOUNTER — Encounter: Payer: Managed Care, Other (non HMO) | Admitting: Internal Medicine

## 2022-12-02 ENCOUNTER — Other Ambulatory Visit: Payer: Self-pay | Admitting: Internal Medicine

## 2022-12-02 DIAGNOSIS — I10 Essential (primary) hypertension: Secondary | ICD-10-CM

## 2022-12-04 NOTE — Progress Notes (Incomplete)
Annual  Screening/Preventative Visit  & Comprehensive Evaluation & Examination   Future Appointments  Date Time Provider Department  12/05/2022  3:00 PM [name], MD [facility]  12/12/2023  3:00 PM [name], MD [facility]            This very nice 62 y.o. single WM presents for a Screening /Preventative Visit & comprehensive evaluation and management of multiple medical co-morbidities.  Patient has been followed for HTN, HLD, T2_NIDDM  Prediabetes and Vitamin D Deficiency.       HTN predates since  2005   Patient's BP has been controlled at home.  Today's  . Patient denies any cardiac symptoms as chest pain, palpitations, shortness of breath, dizziness or ankle swelling.       Patient's hyperlipidemia is controlled with diet and medications. Patient denies myalgias or other medication SE's. Last lipids were   Lab Results  Component Value Date   CHOL 164 06/06/2021   HDL 26 (L) 06/06/2021   LDLCALC 95 06/06/2021   TRIG 323 (H) 06/06/2021   CHOLHDL 6.3 (H) 06/06/2021         Patient has hx/o  prediabetes w /Insulin Resistance (A1c 5.0% /Insulin 36 /2015)  and patient denies reactive hypoglycemic symptoms, visual blurring, diabetic polys or paresthesias. Last A1c was normal & at goal :   Lab Results  Component Value Date   HGBA1C 5.4 03/12/2018          Finally, patient has history of Vitamin D Deficiency ("23"/2008) and last vitamin D was   Lab Results  Component Value Date   VD25OH 70 03/12/2018         Current Outpatient Medications  Medication Instructions  . aspirin 81 mg, Daily  . bisoprolol-hydrochlorothiazide (ZIAC) 10-6.25 MG tablet Take 1 tablet by mouth once daily for blood pressure  . Cholecalciferol (VITAMIN D PO) 2,000 Units, Oral, Takes 8000 units daily   . ezetimibe (ZETIA) 10 MG tablet Take  1 tablet  Daily  for Cholesterol  . fish oil-omega-3 fatty acids 1 g, Oral, Daily,    . Flaxseed, Linseed, (FLAXSEED OIL) 1000 MG CAPS Oral,  Daily,    . hydrocortisone (ANUSOL-HC) 2.5 % rectal cream Apply Rectally 3 to 4 x day  . Magnesium 500 MG TABS 1 tablet, Oral, Daily  . Multiple Vitamin (MULTIVITAMIN) tablet 1 tablet, Oral, Daily,    . NEEDLE, DISP, 21 G 21G X 1" MISC Use for testosterone injections, 3 ml  . olmesartan (BENICAR) 40 MG tablet Take 1 tablet Daily for BP  . testosterone cypionate (DEPOTESTOSTERONE CYPIONATE) 200 MG/ML injection Inject  1 Ml (200 mg)  into Muscle  every 7 days  . zinc gluconate 50 mg, Oral, Daily      Allergies  Allergen Reactions  . Pravastatin Sodium Other (See Comments)    myalgias  . Crestor [Rosuvastatin]     Elevated CPK  . Lipitor [Atorvastatin]     Myalgias  . Tricor [Fenofibrate]     Myalgias  . Zocor [Simvastatin]     Myalgias      Past Medical History:  Diagnosis Date  . Allergy   . Hemorrhoids   . Hyperlipidemia   . Hypertension   . Kidney stone   . Testosterone deficiency   . Vitamin D deficiency      Health Maintenance  Topic Date Due  . COVID-19 Vaccine (1) Never done  . Hepatitis C Screening  Never done  . Zoster Vaccines- Shingrix (1 of 2)  Never done  . COLONOSCOPY (Pts 45-28yr Insurance coverage will need to be confirmed)  12/11/2021  . INFLUENZA VACCINE  05/16/2022  . DTaP/Tdap/Td (3 - Tdap) 03/12/2028  . HIV Screening  Completed  . HPV VACCINES  Aged Out     Immunization History  Administered Date(s) Administered  . DT (Pediatric) 10/19/2003  . Influenza Split 07/10/2013  . PPD Test 03/12/2018  . Pneumococcal Polysaccharide-23 03/03/2009  . Td 03/12/2018    Last Colon -   Past Surgical History:  Procedure Laterality Date  . BASAL CELL CARCINOMA EXCISION     rt. eyelid  . COLONOSCOPY  12/12/2011   Procedure: COLONOSCOPY;  Surgeon: CGatha Mayer MD;  Location: [facility];  Service: Endoscopy;  Laterality: N/A;  with possible hemorrhoid banding  . WISDOM TOOTH EXTRACTION  1999     Family History  Problem Relation Age of  Onset  . Colon cancer Neg Hx   . Esophageal cancer Neg Hx   . Stomach cancer Neg Hx   . Diabetes Mother   . Diabetes Other        Maternal grandmother  . Breast cancer Other        Maternal grandmother  . Lung cancer Father      Social History   Tobacco Use  . Smoking status: Never  . Smokeless tobacco: Never  Substance Use Topics  . Alcohol use: Yes    Alcohol/week: 7.0 standard drinks of alcohol    Types: 7 drink(s) per week    Comment: 1 mixed drink per month  . Drug use: No      ROS Constitutional: Denies fever, chills, weight loss/gain, headaches, insomnia,  night sweats or change in appetite. Does c/o fatigue. Eyes: Denies redness, blurred vision, diplopia, discharge, itchy or watery eyes.  ENT: Denies discharge, congestion, post nasal drip, epistaxis, sore throat, earache, hearing loss, dental pain, Tinnitus, Vertigo, Sinus pain or snoring.  Cardio: Denies chest pain, palpitations, irregular heartbeat, syncope, dyspnea, diaphoresis, orthopnea, PND, claudication or edema Respiratory: denies cough, dyspnea, DOE, pleurisy, hoarseness, laryngitis or wheezing.  Gastrointestinal: Denies dysphagia, heartburn, reflux, water brash, pain, cramps, nausea, vomiting, bloating, diarrhea, constipation, hematemesis, melena, hematochezia, jaundice or hemorrhoids Genitourinary: Denies dysuria, frequency, urgency, nocturia, hesitancy, discharge, hematuria or flank pain Musculoskeletal: Denies arthralgia, myalgia, stiffness, Jt. Swelling, pain, limp or strain/sprain. Denies Falls. Skin: Denies puritis, rash, hives, warts, acne, eczema or change in skin lesion Neuro: No weakness, tremor, incoordination, spasms, paresthesia or pain Psychiatric: Denies confusion, memory loss or sensory loss. Denies Depression. Endocrine: Denies change in weight, skin, hair change, nocturia, and paresthesia, diabetic polys, visual blurring or hyper / hypo glycemic episodes.  Heme/Lymph: No excessive bleeding,  bruising or enlarged lymph nodes.   Physical Exam  There were no vitals taken for this visit.  General Appearance: Well nourished and well groomed and in no apparent distress.  Eyes: PERRLA, EOMs, conjunctiva no swelling or erythema, normal fundi and vessels. Sinuses: No frontal/maxillary tenderness ENT/Mouth: EACs patent / TMs  nl. Nares clear without erythema, swelling, mucoid exudates. Oral hygiene is good. No erythema, swelling, or exudate. Tongue normal, non-obstructing. Tonsils not swollen or erythematous. Hearing normal.  Neck: Supple, thyroid not palpable. No bruits, nodes or JVD. Respiratory: Respiratory effort normal.  BS equal and clear bilateral without rales, rhonci, wheezing or stridor. Cardio: Heart sounds are normal with regular rate and rhythm and no murmurs, rubs or gallops. Peripheral pulses are normal and equal bilaterally without edema. No aortic or femoral bruits. Chest: symmetric  with normal excursions and percussion.  Abdomen: Soft, with Nl bowel sounds. Nontender, no guarding, rebound, hernias, masses, or organomegaly.  Lymphatics: Non tender without lymphadenopathy.  Musculoskeletal: Full ROM all peripheral extremities, joint stability, 5/5 strength, and normal gait. Skin: Warm and dry without rashes, lesions, cyanosis, clubbing or  ecchymosis.  Neuro: Cranial nerves intact, reflexes equal bilaterally. Normal muscle tone, no cerebellar symptoms. Sensation intact.  Pysch: Alert and oriented X 3 with normal affect, insight and judgment appropriate.   Assessment and Plan  1. Annual Preventative/Screening Exam            Patient was counseled in prudent diet, weight control to achieve/maintain BMI less than 25, BP monitoring, regular exercise and medications as discussed.  Discussed med effects and SE's. Routine screening labs and tests as requested with regular follow-up as recommended. Over 40 minutes of exam, counseling, chart review and high complex critical  decision making was performed   Kirtland Bouchard, MD

## 2022-12-04 NOTE — Patient Instructions (Signed)

## 2022-12-04 NOTE — Progress Notes (Signed)
Annual  Screening/Preventative Visit  & Comprehensive Evaluation & Examination   Future Appointments  Date Time Provider Department  12/05/2022  3:00 PM [name], MD [facility]  12/12/2023  3:00 PM [name], MD [facility]            This very nice 62 y.o. single WM presents for a Screening /Preventative Visit & comprehensive evaluation and management of multiple medical co-morbidities.  Patient has been followed for HTN, HLD, Prediabetes and Vitamin D Deficiency.       HTN predates since  2005 .  Patient's BP has been controlled at home.  Today's BP is at  goal - 136/84. Patient denies any cardiac symptoms as chest pain, palpitations, shortness of breath, dizziness or ankle swelling.       Patient's hyperlipidemia is controlled with diet and medications. Patient denies myalgias or other medication SE's. Last lipids were at goal except elevated Trig's :  Lab Results  Component Value Date   CHOL 164 06/06/2021   HDL 26 (L) 06/06/2021   LDLCALC 95 06/06/2021   TRIG 323 (H) 06/06/2021   CHOLHDL 6.3 (H) 06/06/2021         Patient has hx/o  prediabetes w /Insulin Resistance (A1c 5.0% /Insulin 36 /2015)  and patient denies reactive hypoglycemic symptoms, visual blurring, diabetic polys or paresthesias. Last A1c was normal & at goal :   Lab Results  Component Value Date   HGBA1C 5.4 03/12/2018          Finally, patient has history of Vitamin D Deficiency ("23"/2008) and last vitamin D was   Lab Results  Component Value Date   VD25OH 38 03/12/2018       Current Outpatient Medications  Medication Instructions   aspirin 81 mg, Daily   bisoprolol-hctz 10-6.25 MG tablet Take 1 tablet  daily for blood pressure   VITAMIN D 2,000 Units Takes 8000 units daily    ezetimibe (Z  10 MG tablet Take  1 tablet  Daily  for Cholesterol   fish oil-omega-3 fatty acids 1 g, Oral, Daily,     FLAXSEED OIL 1000 MG CAPS Oral, Daily,     hydrocortisone (ANUSOL-HC) 2.5 % rectal  cream Apply Rectally 3 to 4 x day   Magnesium 500 MG TABS 1 tablet, Oral, Daily   Multiple Vitamin  1 tablet, Oral, Daily,     olmesartan (BENICAR) 40 MG tablet Take 1 tablet Daily for BP   testosterone cypio 200 MG/ML injec Inject  1 Ml (200 mg)  into Muscle  every 7 days   zinc 50 mg , Oral, Daily      Allergies  Allergen Reactions   Pravastatin Sodium Other (See Comments)    myalgias   Crestor [Rosuvastatin]     Elevated CPK   Lipitor [Atorvastatin]     Myalgias   Tricor [Fenofibrate]     Myalgias   Zocor [Simvastatin]     Myalgias      Past Medical History:  Diagnosis Date   Allergy    Hemorrhoids    Hyperlipidemia    Hypertension    Kidney stone    Testosterone deficiency    Vitamin D deficiency      Health Maintenance  Topic Date Due   COVID-19 Vaccine (1) Never done   Hepatitis C Screening  Never done   Zoster Vaccines- Shingrix (1 of 2) Never done   COLONOSCOPY (Pts 45-59yr Insurance coverage will need to be confirmed)  12/11/2021   INFLUENZA VACCINE  05/16/2022   DTaP/Tdap/Td (3 - Tdap) 03/12/2028   HIV Screening  Completed   HPV VACCINES  Aged Out     Immunization History  Administered Date(s) Administered   DT (Pediatric) 10/19/2003   Influenza Split 07/10/2013   PPD Test 03/12/2018   Pneumococcal Polysaccharide-23 03/03/2009   Td 03/12/2018    Last Colon -   Past Surgical History:  Procedure Laterality Date   BASAL CELL CARCINOMA EXCISION     rt. eyelid   COLONOSCOPY  12/12/2011   Procedure: COLONOSCOPY;  Surgeon: Gatha Mayer, MD;  Location: [facility];  Service: Endoscopy;  Laterality: N/A;  with possible hemorrhoid banding   WISDOM TOOTH EXTRACTION  1999     Family History  Problem Relation Age of Onset   Colon cancer Neg Hx    Esophageal cancer Neg Hx    Stomach cancer Neg Hx    Diabetes Mother    Diabetes Other        Maternal grandmother   Breast cancer Other        Maternal grandmother   Lung cancer Father       Social History   Tobacco Use   Smoking status: Never   Smokeless tobacco: Never  Substance Use Topics   Alcohol use: Yes    Alcohol/week: 7.0 standard drinks of alcohol    Types: 7 drink(s) per week    Comment: 1 mixed drink per month   Drug use: No      ROS Constitutional: Denies fever, chills, weight loss/gain, headaches, insomnia,  night sweats or change in appetite. Does c/o fatigue. Eyes: Denies redness, blurred vision, diplopia, discharge, itchy or watery eyes.  ENT: Denies discharge, congestion, post nasal drip, epistaxis, sore throat, earache, hearing loss, dental pain, Tinnitus, Vertigo, Sinus pain or snoring.  Cardio: Denies chest pain, palpitations, irregular heartbeat, syncope, dyspnea, diaphoresis, orthopnea, PND, claudication or edema Respiratory: denies cough, dyspnea, DOE, pleurisy, hoarseness, laryngitis or wheezing.  Gastrointestinal: Denies dysphagia, heartburn, reflux, water brash, pain, cramps, nausea, vomiting, bloating, diarrhea, constipation, hematemesis, melena, hematochezia, jaundice or hemorrhoids Genitourinary: Denies dysuria, frequency, urgency, nocturia, hesitancy, discharge, hematuria or flank pain Musculoskeletal: Denies arthralgia, myalgia, stiffness, Jt. Swelling, pain, limp or strain/sprain. Denies Falls. Skin: Denies puritis, rash, hives, warts, acne, eczema or change in skin lesion Neuro: No weakness, tremor, incoordination, spasms, paresthesia or pain Psychiatric: Denies confusion, memory loss or sensory loss. Denies Depression. Endocrine: Denies change in weight, skin, hair change, nocturia, and paresthesia, diabetic polys, visual blurring or hyper / hypo glycemic episodes.  Heme/Lymph: No excessive bleeding, bruising or enlarged lymph nodes.   Physical Exam  BP 136/84   Pulse (!) 50   Temp 97.9 F (36.6 C)   Resp 16   Ht 5' 6"$  (1.676 m)   Wt 210 lb (95.3 kg)   SpO2 96%   BMI 33.89 kg/m   General Appearance: Well nourished and  well groomed and in no apparent distress.  Eyes: PERRLA, EOMs, conjunctiva no swelling or erythema, normal fundi and vessels. Sinuses: No frontal/maxillary tenderness ENT/Mouth: EACs patent / TMs  nl. Nares clear without erythema, swelling, mucoid exudates. Oral hygiene is good. No erythema, swelling, or exudate. Tongue normal, non-obstructing. Tonsils not swollen or erythematous. Hearing normal.  Neck: Supple, thyroid not palpable. No bruits, nodes or JVD. Respiratory: Respiratory effort normal.  BS equal and clear bilateral without rales, rhonci, wheezing or stridor. Cardio: Heart sounds are normal with regular rate and rhythm and no murmurs, rubs or gallops. Peripheral pulses are  normal and equal bilaterally without edema. No aortic or femoral bruits. Chest: symmetric with normal excursions and percussion.  Abdomen: Soft, with Nl bowel sounds. Nontender, no guarding, rebound, hernias, masses, or organomegaly.  Lymphatics: Non tender without lymphadenopathy.  Musculoskeletal: Full ROM all peripheral extremities, joint stability, 5/5 strength, and normal gait. Skin: Warm and dry without rashes, lesions, cyanosis, clubbing or  ecchymosis.  Neuro: Cranial nerves intact, reflexes equal bilaterally. Normal muscle tone, no cerebellar symptoms. Sensation intact.  Pysch: Alert and oriented X 3 with normal affect, insight and judgment appropriate.   Assessment and Plan  1. Annual Preventative/Screening Exam    2. Essential hypertension  - EKG 12-Lead - [place], RETROPERITNL ABD,  LTD - Urinalysis, Routine w reflex microscopic - Microalbumin / creatinine urine ratio - CBC with Differential/Platelet - COMPLETE METABOLIC PANEL WITH GFR - Magnesium - TSH  3. Hyperlipidemia, mixed  - EKG 12-Lead - [place], RETROPERITNL ABD,  LTD - Lipid panel  4. Abnormal glucose  - EKG 12-Lead - [place], RETROPERITNL ABD,  LTD - Hemoglobin A1c - Insulin, random  5. Vitamin D deficiency  - VITAMIN D 25 Hydroxy    6. Testosterone deficiency  - Testosterone  7. Screening-pulmonary TB  - TB Skin Test  8. Screening for heart disease  - EKG 12-Lead  9. FHx: heart disease  - EKG 12-Lead - [place], RETROPERITNL ABD,  LTD  10. Screening for AAA (aortic abdominal aneurysm)  - [place], RETROPERITNL ABD,  LTD  11. Fatigue, unspecified type  - Iron, Total/Total Iron Binding Cap - Vitamin B12 - CBC with Differential/Platelet - TSH  12. Medication management  - Urinalysis, Routine w reflex microscopic - Testosterone - CBC with Differential/Platelet - COMPLETE METABOLIC PANEL WITH GFR - Magnesium - Lipid panel - TSH - Hemoglobin A1c - Insulin, random - VITAMIN D 25 Hydroxy   13. Screening for colorectal cancer  - POC Hemoccult Bld/Stl   14. Prostate cancer screening  - PSA          Patient was counseled in prudent diet, weight control to achieve/maintain BMI less than 25, BP monitoring, regular exercise and medications as discussed.  Discussed med effects and SE's. Routine screening labs and tests as requested with regular follow-up as recommended. Over 40 minutes of exam, counseling, chart review and high complex critical decision making was performed   Kirtland Bouchard, MD

## 2022-12-05 ENCOUNTER — Ambulatory Visit: Payer: Managed Care, Other (non HMO) | Admitting: Internal Medicine

## 2022-12-05 ENCOUNTER — Encounter: Payer: Self-pay | Admitting: Internal Medicine

## 2022-12-05 VITALS — BP 136/84 | HR 50 | Temp 97.9°F | Resp 16 | Ht 66.0 in | Wt 210.0 lb

## 2022-12-05 DIAGNOSIS — Z125 Encounter for screening for malignant neoplasm of prostate: Secondary | ICD-10-CM

## 2022-12-05 DIAGNOSIS — I700 Atherosclerosis of aorta: Secondary | ICD-10-CM

## 2022-12-05 DIAGNOSIS — Z8249 Family history of ischemic heart disease and other diseases of the circulatory system: Secondary | ICD-10-CM

## 2022-12-05 DIAGNOSIS — Z0001 Encounter for general adult medical examination with abnormal findings: Secondary | ICD-10-CM

## 2022-12-05 DIAGNOSIS — Z111 Encounter for screening for respiratory tuberculosis: Secondary | ICD-10-CM

## 2022-12-05 DIAGNOSIS — N401 Enlarged prostate with lower urinary tract symptoms: Secondary | ICD-10-CM

## 2022-12-05 DIAGNOSIS — Z136 Encounter for screening for cardiovascular disorders: Secondary | ICD-10-CM

## 2022-12-05 DIAGNOSIS — Z131 Encounter for screening for diabetes mellitus: Secondary | ICD-10-CM | POA: Diagnosis not present

## 2022-12-05 DIAGNOSIS — N3000 Acute cystitis without hematuria: Secondary | ICD-10-CM

## 2022-12-05 DIAGNOSIS — R7309 Other abnormal glucose: Secondary | ICD-10-CM

## 2022-12-05 DIAGNOSIS — Z130 Encounter for screening for diseases of the blood and blood-forming organs and certain disorders involving the immune mechanism: Secondary | ICD-10-CM | POA: Diagnosis not present

## 2022-12-05 DIAGNOSIS — E349 Endocrine disorder, unspecified: Secondary | ICD-10-CM

## 2022-12-05 DIAGNOSIS — R350 Frequency of micturition: Secondary | ICD-10-CM

## 2022-12-05 DIAGNOSIS — R5383 Other fatigue: Secondary | ICD-10-CM

## 2022-12-05 DIAGNOSIS — I10 Essential (primary) hypertension: Secondary | ICD-10-CM | POA: Diagnosis not present

## 2022-12-05 DIAGNOSIS — Z1389 Encounter for screening for other disorder: Secondary | ICD-10-CM | POA: Diagnosis not present

## 2022-12-05 DIAGNOSIS — Z0000 Encounter for general adult medical examination without abnormal findings: Secondary | ICD-10-CM | POA: Diagnosis not present

## 2022-12-05 DIAGNOSIS — E559 Vitamin D deficiency, unspecified: Secondary | ICD-10-CM

## 2022-12-05 DIAGNOSIS — Z13220 Encounter for screening for lipoid disorders: Secondary | ICD-10-CM | POA: Diagnosis not present

## 2022-12-05 DIAGNOSIS — E782 Mixed hyperlipidemia: Secondary | ICD-10-CM

## 2022-12-05 DIAGNOSIS — Z79899 Other long term (current) drug therapy: Secondary | ICD-10-CM

## 2022-12-05 DIAGNOSIS — Z1211 Encounter for screening for malignant neoplasm of colon: Secondary | ICD-10-CM

## 2022-12-05 DIAGNOSIS — Z1329 Encounter for screening for other suspected endocrine disorder: Secondary | ICD-10-CM

## 2022-12-06 ENCOUNTER — Other Ambulatory Visit: Payer: Self-pay | Admitting: Internal Medicine

## 2022-12-06 DIAGNOSIS — N3000 Acute cystitis without hematuria: Secondary | ICD-10-CM

## 2022-12-06 LAB — URINALYSIS, ROUTINE W REFLEX MICROSCOPIC
Bacteria, UA: NONE SEEN /HPF
Bilirubin Urine: NEGATIVE
Glucose, UA: NEGATIVE
Hgb urine dipstick: NEGATIVE
Hyaline Cast: NONE SEEN /LPF
Ketones, ur: NEGATIVE
Nitrite: NEGATIVE
Protein, ur: NEGATIVE
RBC / HPF: NONE SEEN /HPF (ref 0–2)
Specific Gravity, Urine: 1.013 (ref 1.001–1.035)
Squamous Epithelial / HPF: NONE SEEN /HPF (ref <=5)
pH: 6.5 (ref 5.0–8.0)

## 2022-12-06 LAB — HEMOGLOBIN A1C
Hgb A1c MFr Bld: 6.2 % of total Hgb — ABNORMAL HIGH (ref <5.7)
Mean Plasma Glucose: 131 mg/dL
eAG (mmol/L): 7.3 mmol/L

## 2022-12-06 LAB — CBC WITH DIFFERENTIAL/PLATELET
Absolute Monocytes: 544 cells/uL (ref 200–950)
Basophils Absolute: 72 cells/uL (ref 0–200)
Basophils Relative: 0.9 %
Eosinophils Absolute: 208 cells/uL (ref 15–500)
Eosinophils Relative: 2.6 %
HCT: 49.9 % (ref 38.5–50.0)
Hemoglobin: 17.6 g/dL — ABNORMAL HIGH (ref 13.2–17.1)
Lymphs Abs: 2152 cells/uL (ref 850–3900)
MCH: 30.4 pg (ref 27.0–33.0)
MCHC: 35.3 g/dL (ref 32.0–36.0)
MCV: 86.3 fL (ref 80.0–100.0)
MPV: 10.5 fL (ref 7.5–12.5)
Monocytes Relative: 6.8 %
Neutro Abs: 5024 cells/uL (ref 1500–7800)
Neutrophils Relative %: 62.8 %
Platelets: 253 10*3/uL (ref 140–400)
RBC: 5.78 10*6/uL (ref 4.20–5.80)
RDW: 13.3 % (ref 11.0–15.0)
Total Lymphocyte: 26.9 %
WBC: 8 10*3/uL (ref 3.8–10.8)

## 2022-12-06 LAB — VITAMIN D 25 HYDROXY (VIT D DEFICIENCY, FRACTURES): Vit D, 25-Hydroxy: 81 ng/mL (ref 30–100)

## 2022-12-06 LAB — IRON, TOTAL/TOTAL IRON BINDING CAP
%SAT: 27 % (calc) (ref 20–48)
Iron: 96 ug/dL (ref 50–180)
TIBC: 352 mcg/dL (calc) (ref 250–425)

## 2022-12-06 LAB — COMPLETE METABOLIC PANEL WITH GFR
AG Ratio: 1.8 (calc) (ref 1.0–2.5)
ALT: 67 U/L — ABNORMAL HIGH (ref 9–46)
AST: 39 U/L — ABNORMAL HIGH (ref 10–35)
Albumin: 4.4 g/dL (ref 3.6–5.1)
Alkaline phosphatase (APISO): 47 U/L (ref 35–144)
BUN: 15 mg/dL (ref 7–25)
CO2: 29 mmol/L (ref 20–32)
Calcium: 10.1 mg/dL (ref 8.6–10.3)
Chloride: 100 mmol/L (ref 98–110)
Creat: 1.21 mg/dL (ref 0.70–1.30)
Globulin: 2.4 g/dL (calc) (ref 1.9–3.7)
Glucose, Bld: 77 mg/dL (ref 65–99)
Potassium: 4.5 mmol/L (ref 3.5–5.3)
Sodium: 139 mmol/L (ref 135–146)
Total Bilirubin: 0.5 mg/dL (ref 0.2–1.2)
Total Protein: 6.8 g/dL (ref 6.1–8.1)
eGFR: 69 mL/min/{1.73_m2} (ref >=60)

## 2022-12-06 LAB — MAGNESIUM: Magnesium: 1.8 mg/dL (ref 1.5–2.5)

## 2022-12-06 LAB — MICROALBUMIN / CREATININE URINE RATIO
Creatinine, Urine: 114 mg/dL (ref 20–320)
Microalb Creat Ratio: 15 mcg/mg creat (ref <30)
Microalb, Ur: 1.7 mg/dL

## 2022-12-06 LAB — LIPID PANEL
Cholesterol: 188 mg/dL (ref <200)
HDL: 27 mg/dL — ABNORMAL LOW (ref >=40)
LDL Cholesterol (Calc): 117 mg/dL (calc) — ABNORMAL HIGH
Non-HDL Cholesterol (Calc): 161 mg/dL (calc) — ABNORMAL HIGH (ref <130)
Total CHOL/HDL Ratio: 7 (calc) — ABNORMAL HIGH (ref <5.0)
Triglycerides: 286 mg/dL — ABNORMAL HIGH (ref <150)

## 2022-12-06 LAB — TSH: TSH: 3.6 mIU/L (ref 0.40–4.50)

## 2022-12-06 LAB — VITAMIN B12: Vitamin B-12: 576 pg/mL (ref 200–1100)

## 2022-12-06 LAB — PSA: PSA: 2.48 ng/mL (ref <=4.00)

## 2022-12-06 LAB — MICROSCOPIC MESSAGE

## 2022-12-06 LAB — TESTOSTERONE: Testosterone: 2272 ng/dL — ABNORMAL HIGH (ref 250–827)

## 2022-12-06 LAB — INSULIN, RANDOM: Insulin: 26 u[IU]/mL — ABNORMAL HIGH

## 2022-12-06 NOTE — Progress Notes (Signed)
<><><><><><><><><><><><><><><><><><><><><><><><><><><><><><><><><> <><><><><><><><><><><><><><><><><><><><><><><><><><><><><><><><><> - Test results slightly outside the reference range are not unusual. If there is anything important, I will review this with you,  otherwise it is considered normal test values.  If you have further questions,  please do not hesitate to contact me at the office or via My Chart.  <><><><><><><><><><><><><><><><><><><><><><><><><><><><><><><><><> <><><><><><><><><><><><><><><><><><><><><><><><><><><><><><><><><>  -  Urine is suspect for UTI , So will order Urine culture to rule out  infection <><><><><><><><><><><><><><><><><><><><><><><><><><><><><><><><><>  -  Testosterone  level is extremely high  from recent shot,   which increases risk for blood clots and also for prostate cancer , so   Recommend decrease the amount from 1 ml down to 1/2 ml  weekly  <><><><><><><><><><><><><><><><><><><><><><><><><><><><><><><><><>  -  Total  Chol =  188    -     slightly Elevated             (  Ideal  or  Goal is less than 180  !  )  & -  Bad / Dangerous LDL  Chol = 117    - also Elevated              (  Ideal  or  Goal is less than 70  !  )   -  since can't take statins need to work harder on diet & weight loss    - Cholesterol only comes from animal sources                                                                        - ie. meat, dairy, egg yolks  - Eat all the vegetables you want.  - Avoid Meat, Avoid Meat,  Avoid Meat                                                    - especially Red Meat - Beef AND Pork .  - Avoid cheese & dairy - milk & ice cream.     - Cheese is the most concentrated form of trans-fats which                                                    is the worst thing to clog up our arteries.   - Veggie cheese is OK which can be found in the fresh  produce section at [employer] or [employer] or  [employer]  <><><><><><><><><><><><><><><><><><><><><><><><><><><><><><><><><> <><><><><><><><><><><><><><><><><><><><><><><><><><><><><><><><><>   -  also Triglycerides (   286   ) or fats in blood are too high                 (   Ideal or  Goal is less than 150  !  )    - Recommend avoid fried & greasy foods,  sweets / candy,   - Avoid white rice  (brown or wild rice or Quinoa is OK),   - Avoid white potatoes  (sweet potatoes are OK)   -  Avoid anything made from white flour  - bagels, doughnuts, rolls, buns, biscuits, white and   wheat breads, pizza crust and traditional  pasta made of white flour & egg white  - (vegetarian pasta or spinach or wheat pasta is OK).    - Multi-grain bread is OK - like multi-grain flat bread or  sandwich thins.   - Avoid alcohol in excess.   - Exercise is also important. <><><><><><><><><><><><><><><><><><><><><><><><><><><><><><><><><> <><><><><><><><><><><><><><><><><><><><><><><><><><><><><><><><><>  -  A1c = 6.2% Blood sugar and A1c are  elevated in the borderline and                                                           early or pre-diabetes range which has the same   300% increased risk for heart attack, stroke, cancer and                                              alzheimer- type vascular dementia as full blown diabetes.   But the good news is that diet, exercise with weight loss can                                                                                cure the early diabetes at this point. <><><><><><><><><><><><><><><><><><><><><><><><><><><><><><><><><>  -  Iron & Vitamin B12 levels a re OK <><><><><><><><><><><><><><><><><><><><><><><><><><><><><><><><><>  -  PSA - still low - Great - No Prostate cancer <><><><><><><><><><><><><><><><><><><><><><><><><><><><><><><><><> <><><><><><><><><><><><><><><><><><><><><><><><><><><><><><><><><>   -   Magnesium  = 1.8    -  very  low  - goal is betw 2.0 - 2.5,   -  So..............Marland Kitchen  Recommend that you take                                                 Magnesium 500 mg tablet 2 x /day with meals   - also important to eat lots of  leafy green vegetables   - spinach - Kale - collards - greens - okra - asparagus  - broccoli - quinoa - squash - almonds   - black, red, white beans  -  peas - green beans <><><><><><><><><><><><><><><><><><><><><><><><><><><><><><><><><> <><><><><><><><><><><><><><><><><><><><><><><><><><><><><><><><><>  -  Vitamin D = 81 -Great  - Vitamin D goal is between 70-100.    - It is very important as a natural anti-inflammatory and helping the  immune system protect against viral infections, like the Covid-19    helping hair, skin, and nails, as well as reducing stroke and  heart attack risk.   - It helps your bones and helps with mood.  - It also decreases numerous cancer risks so please  take it as directed.   - Low Vit D is associated with a 200-300% higher risk for  CANCER   and 200-300%  higher risk for HEART   ATTACK  &  STROKE.    - It is also associated with higher death rate at younger ages,   autoimmune diseases like Rheumatoid arthritis, Lupus,  Multiple Sclerosis.     - Also many other serious conditions, like depression, Alzheimer's  Dementia, infertility, muscle aches, fatigue, fibromyalgia  <><><><><><><><><><><><><><><><><><><><><><><><><><><><><><><><><>  -  All Else - CBC - Kidneys - Electrolytes - Liver - Magnesium & Thyroid    - all  Normal / OK <><><><><><><><><><><><><><><><><><><><><><><><><><><><><><><><><> <><><><><><><><><><><><><><><><><><><><><><><><><><><><><><><><><>

## 2022-12-09 LAB — URINE CULTURE
MICRO NUMBER:: 14602450
SPECIMEN QUALITY:: ADEQUATE

## 2022-12-09 NOTE — Progress Notes (Signed)
<><><><><><><><><><><><><><><><><><><><><><><><><><><><><><><><><> <><><><><><><><><><><><><><><><><><><><><><><><><><><><><><><><><>  -   U/C - returned OK - No Infection ? ?<><><><><><><><><><><><><><><><><><><><><><><><><><><><><><><><>< ?<><><><><><><><><><><><><><><><><><><><><><><><><><><><><><><><>< ? ? ? ?

## 2022-12-11 NOTE — Progress Notes (Signed)
Patient is aware of urine results. -e welch

## 2023-01-10 ENCOUNTER — Other Ambulatory Visit: Payer: Self-pay | Admitting: Internal Medicine

## 2023-01-10 DIAGNOSIS — I10 Essential (primary) hypertension: Secondary | ICD-10-CM

## 2023-02-02 ENCOUNTER — Telehealth: Payer: Self-pay

## 2023-02-02 NOTE — Telephone Encounter (Signed)
Patient called to report tick bite. Reports the tick has been removed and was found to have a dime size red area, but no bulls eye or red striation rash. Anda Kraft, ANP was consulted and advised the patient that if bulls eye appears, fever, nausea, headache, blurry vision or any other concerning symptoms he should call 911 or report to his nearest Emergency Room for evaluation and treatment. The patient voiced clear understanding to this staff member.

## 2023-02-27 ENCOUNTER — Other Ambulatory Visit: Payer: Self-pay | Admitting: Nurse Practitioner

## 2023-02-27 DIAGNOSIS — I10 Essential (primary) hypertension: Secondary | ICD-10-CM

## 2023-03-01 ENCOUNTER — Ambulatory Visit: Payer: Managed Care, Other (non HMO) | Admitting: Nurse Practitioner

## 2023-03-01 ENCOUNTER — Encounter: Payer: Self-pay | Admitting: Nurse Practitioner

## 2023-03-01 VITALS — BP 130/80 | HR 79 | Temp 97.9°F | Resp 16 | Ht 66.0 in | Wt 209.0 lb

## 2023-03-01 DIAGNOSIS — S90862A Insect bite (nonvenomous), left foot, initial encounter: Secondary | ICD-10-CM

## 2023-03-01 DIAGNOSIS — I10 Essential (primary) hypertension: Secondary | ICD-10-CM

## 2023-03-01 MED ORDER — DOXYCYCLINE HYCLATE 100 MG PO CAPS: ORAL_CAPSULE | ORAL | 0 refills | Status: DC

## 2023-03-01 NOTE — Progress Notes (Signed)
Assessment and Plan:  Sean Vasquez was seen today for acute visit.  Diagnoses and all orders for this visit:  Essential hypertension - continue medications, DASH diet, exercise and monitor at home. Call if greater than 130/80.   Tick bite of foot, left, initial encounter Diagnosis: Foreign Body - Location: left upper foot Procedure: Foreign body removal Type of extraction: simple Informed consent:  Discussed the risks (permanent scarring, light or dark discoloration, infection, pain, bleeding, bruising, redness, blister formation, and recurrence of the lesion) and the benefits of the procedure, as well as the alternatives.  Informed consent was obtained. Anesthesia: none The area was prepared and draped in a standard fashion. The area was carefully explored until the foreign body was identified. and It was extracted by forceps retrieval. Meticulous hemostasis was obtained with pressure. Repair: Secondary Intention Healing Ointment and dressing were applied. The foreign body was a tick and removed in whole The patient tolerated the procedure well. The patient was instructed on post-op care.    -     doxycycline (VIBRAMYCIN) 100 MG capsule; Take 1 capsule twice daily with food       Further disposition pending results of labs. Discussed med's effects and SE's.   Over 30 minutes of exam, counseling, chart review, and critical decision making was performed.   Future Appointments  Date Time Provider Department Center  03/13/2023  3:30 PM Adela Glimpse, NP [facility] None  06/13/2023  4:00 PM Lucky Cowboy, MD [facility] None  12/12/2023  3:00 PM Lucky Cowboy, MD [facility] None    ------------------------------------------------------------------------------------------------------------------   HPI BP 130/80   Pulse 79   Temp 97.9 F (36.6 C)   Resp 16   Ht 5\' 6"  (1.676 m)   Wt 209 lb (94.8 kg)   SpO2 96%   BMI 33.73 kg/m   62 y.o.male presents for bite on top of   left foot, believed to be a tick bite. He was sitting on porch in bare feet and noticed a bite 6 days ago.  The area is not tender, was initially a little itchy.  Did not see a tick and reddened area is getting worse.   He was previously bit by another tick 3-4 weeks ago on right thigh but removed entire tick and did not develop a rash. Small nodule noted but no rash  BP is currently well controlled with Ziac 10/6.25mg  QD and Olmesartan 40 mg QD .  Denies headaches, chest pain, shortness of breath and dizziness.  BP Readings from Last 3 Encounters:  03/01/23 130/80  12/05/22 136/84  05/22/22 (!) 158/90      Past Medical History:  Diagnosis Date   Allergy    Hemorrhoids    Hyperlipidemia    Hypertension    Kidney stone    Testosterone deficiency    Vitamin D deficiency      Allergies  Allergen Reactions   Pravastatin Sodium Other (See Comments)    myalgias   Crestor [Rosuvastatin]     Elevated CPK   Lipitor [Atorvastatin]     Myalgias   Tricor [Fenofibrate]     Myalgias   Zocor [Simvastatin]     Myalgias     Current Outpatient Medications on File Prior to Visit  Medication Sig   aspirin 81 MG chewable tablet Chew 81 mg by mouth daily.   bisoprolol-hydrochlorothiazide (ZIAC) 10-6.25 MG tablet Take 1 tablet by mouth once daily for blood pressure   Cholecalciferol (VITAMIN D PO) Take 2,000 Units by mouth. Takes 8000 units  daily   ezetimibe (ZETIA) 10 MG tablet Take  1 tablet  Daily  for Cholesterol   fish oil-omega-3 fatty acids 1000 MG capsule Take 1 g by mouth daily.   Flaxseed, Linseed, (FLAXSEED OIL) 1000 MG CAPS Take by mouth daily.   hydrocortisone (ANUSOL-HC) 2.5 % rectal cream Apply Rectally 3 to 4 x day   Magnesium 500 MG TABS Take 1 tablet by mouth daily.   Multiple Vitamin (MULTIVITAMIN) tablet Take 1 tablet by mouth daily.   NEEDLE, DISP, 21 G 21G X 1" MISC Use for testosterone injections, 3 ml   olmesartan (BENICAR) 40 MG tablet Take  1 tablet  Daily for BP                                                                             /                                                    Take                                                   by                                           mouth                                             once?daily   testosterone cypionate (DEPOTESTOSTERONE CYPIONATE) 200 MG/ML injection Inject  1 Ml (200 mg)  into Muscle  every 7 days   zinc gluconate 50 MG tablet Take 50 mg by mouth daily.   No current facility-administered medications on file prior to visit.    ROS: all negative except above.   Physical Exam:  BP 130/80   Pulse 79   Temp 97.9 F (36.6 C)   Resp 16   Ht 5\' 6"  (1.676 m)   Wt 209 lb (94.8 kg)   SpO2 96%   BMI 33.73 kg/m   General Appearance: Well nourished, in no apparent distress. Eyes: PERRLA, EOMs, conjunctiva no swelling or erythema Sinuses: No Frontal/maxillary tenderness Respiratory: Respiratory effort normal, BS equal bilaterally without rales, rhonchi, wheezing or stridor.  Cardio: RRR with no MRGs. Brisk peripheral pulses without edema.  Abdomen: Soft, + BS.  Non tender, no guarding, rebound, hernias, masses. Lymphatics: Non tender without lymphadenopathy.  Musculoskeletal: Full ROM, 5/5 strength, normal gait.  Skin: Warm, dry . Bulls eye rash of top of left foot encompassing 4-5 cm of top of foot and 2nd and 3rd toes.  Blackened hardened area in center.  Removed tick from center- procedure note below. He was previously bit by another tick  on right thigh but removed entire tick and did not develop a rash. Small nodule noted but no rash Neuro: Cranial nerves intact. Normal muscle tone, no cerebellar symptoms. Sensation intact.  Psych: Awake and oriented X 3, normal affect, Insight and Judgment appropriate.    Diagnosis: Foreign Body - Location: left upper foot Procedure: Foreign body removal Type of extraction: simple Informed consent:  Discussed the risks (permanent scarring, light  or dark discoloration, infection, pain, bleeding, bruising, redness, blister formation, and recurrence of the lesion) and the benefits of the procedure, as well as the alternatives.  Informed consent was obtained. Anesthesia: none The area was prepared and draped in a standard fashion. The area was carefully explored until the foreign body was identified. and It was extracted by forceps retrieval. Meticulous hemostasis was obtained with pressure. Repair: Secondary Intention Healing Ointment and dressing were applied. The foreign body was a tick and removed in whole The patient tolerated the procedure well. The patient was instructed on post-op care.     Raynelle Dick, NP 3:32 PM [facility]

## 2023-03-01 NOTE — Patient Instructions (Signed)
Insect Bite, Adult An insect bite can make your skin red, itchy, and swollen. An insect bite is different from an insect sting, which happens when an insect injects poison (venom) into the skin. Some insects can spread disease to people through a bite. However, most insect bites do not lead to disease and are not serious. What are the causes? Insects may bite for a variety of reasons, including: Hunger. To defend themselves. Insects that bite include: Spiders. Mosquitoes and flies. Ticks and fleas. Ants. Kissing bugs. Chiggers. What are the signs or symptoms? In many cases, symptoms last for 2-4 days. However, itching can last up to 10 days. Symptoms include: Itching or pain in the bite area. Redness and swelling in the bite area. An open wound (skin ulcer). In rare cases, a person may have a severe allergic reaction (anaphylactic reaction) to a bite. Symptoms of an anaphylactic reaction may include: Feeling warm in the face (flushed). This may include redness. Itchy, red, swollen areas of skin (hives). Swelling of the eyes, lips, face, mouth, tongue, or throat. Wheezing or difficulty breathing, speaking, or swallowing. Dizziness, light-headedness, or fainting. Abdominal symptoms like cramping, nausea, vomiting, or diarrhea. How is this diagnosed? This condition is usually diagnosed based on symptoms and a physical exam. During the exam, your health care provider will look at the bite and ask you what kind of insect bit you. How is this treated? Most insect bites are not serious. Symptoms often go away on their own and treatment is not usually needed. When treatment is recommended, it may include: Applying ice to the affected area. Applying steroid or other anti-itch creams, like calamine lotion, to the bite area. Medicines called antihistamines to reduce itching. You may also need: A tetanus shot if you are not up to date. Antibiotic cream or an oral antibiotic if the bite becomes  infected (this is uncommon). Follow these instructions at home: Bite area care  Do not scratch the bite area. It may help to cover the bite area with a bandage or close-fitting clothing. Keep the bite area clean and dry. Wash it every day with soap and water as told by your health care provider. Check the bite area every day for signs of infection. Check for: More redness, swelling, or pain. Fluid or blood. Warmth. Pus or a bad smell. Managing pain, itching, and swelling  You may apply cortisone cream, calamine lotion, or a paste made of baking soda and water to the bite area as told by your health care provider. If directed, put ice on the bite area. To do this: Put ice in a plastic bag. Place a towel between your skin and the bag. Leave the ice on for 20 minutes, 2-3 times a day. If your skin turns bright red, remove the ice right away to prevent skin damage. The risk of skin damage is higher if you cannot feel pain, heat, or cold. General instructions Apply or take over-the-counter and prescription medicine only as told by your health care provider. If you were prescribed antibiotics, take or apply them as told by your health care provider. Do not stop using the antibiotic even if you start to feel better. How is this prevented? To help reduce your risk of insect bites: When you are outdoors, wear clothing that covers your arms and legs. This is especially important in the early morning and evening. Use insect repellent. The best insect repellents contain DEET, picaridin, oil of lemon eucalyptus (OLE), or IR3535. Consider spraying your clothing   with a pesticide called permethrin. Permethrin helps prevent insect bites. It works for several weeks and for up to 5-6 clothing washes. Do not apply permethrin directly to the skin. If your home windows do not have screens, consider installing them. If you will be sleeping in an area where there are mosquitoes, consider covering your sleeping  area with a mosquito net. Contact a health care provider if: Your bite area has signs of infection, such as: More redness, swelling, or pain. Fluid or blood. Warmth. Pus or a bad smell. You have a fever. Get help right away if: You have a rash. You have muscle or joint pain. You feel unusually tired or weak. You have neck pain or a headache. You develop symptoms of an anaphylactic reaction. These may include: Swelling of the eyes, lips, face, mouth, tongue, or throat. Flushed skin or hives. Wheezing. Difficulty breathing, speaking, or swallowing. Dizziness, light-headedness, or fainting. Abdominal pain, cramping, vomiting, or diarrhea. These symptoms may be an emergency. Get help right away. Call 911. Do not wait to see if the symptoms will go away. Do not drive yourself to the hospital. Summary An insect bite can make your skin red, itchy, and swollen. Treatment is usually not needed. Symptoms often go away on their own. When treatment is recommended, it may involve taking medicine, applying medicine to the area, or applying ice. Apply or take over-the-counter and prescription medicines only as told by your health care provider. Use insect repellent to help prevent insect bites. Contact a health care provider if your bite area has signs of infection. This information is not intended to replace advice given to you by your health care provider. Make sure you discuss any questions you have with your health care provider. Document Revised: 01/11/2022 Document Reviewed: 12/27/2021 Elsevier Patient Education  2023 [employer]  

## 2023-03-13 ENCOUNTER — Ambulatory Visit: Payer: Managed Care, Other (non HMO) | Admitting: Nurse Practitioner

## 2023-03-13 ENCOUNTER — Encounter: Payer: Self-pay | Admitting: Nurse Practitioner

## 2023-03-13 VITALS — BP 150/98 | HR 60 | Temp 97.6°F | Ht 66.0 in | Wt 203.8 lb

## 2023-03-13 DIAGNOSIS — K219 Gastro-esophageal reflux disease without esophagitis: Secondary | ICD-10-CM

## 2023-03-13 DIAGNOSIS — E782 Mixed hyperlipidemia: Secondary | ICD-10-CM | POA: Diagnosis not present

## 2023-03-13 DIAGNOSIS — E349 Endocrine disorder, unspecified: Secondary | ICD-10-CM | POA: Diagnosis not present

## 2023-03-13 DIAGNOSIS — R0789 Other chest pain: Secondary | ICD-10-CM

## 2023-03-13 DIAGNOSIS — M19079 Primary osteoarthritis, unspecified ankle and foot: Secondary | ICD-10-CM

## 2023-03-13 DIAGNOSIS — M19042 Primary osteoarthritis, left hand: Secondary | ICD-10-CM

## 2023-03-13 DIAGNOSIS — R42 Dizziness and giddiness: Secondary | ICD-10-CM

## 2023-03-13 DIAGNOSIS — E669 Obesity, unspecified: Secondary | ICD-10-CM

## 2023-03-13 DIAGNOSIS — M19041 Primary osteoarthritis, right hand: Secondary | ICD-10-CM

## 2023-03-13 DIAGNOSIS — Z79899 Other long term (current) drug therapy: Secondary | ICD-10-CM

## 2023-03-13 DIAGNOSIS — I10 Essential (primary) hypertension: Secondary | ICD-10-CM

## 2023-03-13 MED ORDER — MECLIZINE HCL 25 MG PO TABS: ORAL_TABLET | ORAL | 0 refills | Status: AC

## 2023-03-13 NOTE — Patient Instructions (Signed)
Suggested ear wash recipe: 1 teaspoon of white vinegar. 1 teaspoon of rubbing alcohol. Mix the ingredients together in a cup. Use an eyedropper to put most of the mixture in the ear. Tilt the head to be sure the earwash runs all the way down inside the ear. Then tilt the head the other way and let the liquid drain out. Continue to monitor.   Dizziness Dizziness is a common problem. It makes you feel unsteady or light-headed. You may feel like you are about to pass out (faint). Dizziness can lead to getting hurt if you stumble or fall. Dizziness can be caused by many things, including: Medicines. Not having enough water in your body (dehydration). Illness. Follow these instructions at home: Eating and drinking  Drink enough fluid to keep your pee (urine) pale yellow. This helps to keep you from getting dehydrated. Try to drink more clear fluids, such as water. Do not drink alcohol. Limit how much caffeine you drink or eat, if your doctor tells you to do that. Limit how much salt (sodium) you drink or eat, if your doctor tells you to do that. Activity  Avoid making quick movements. Stand up slowly from sitting in a chair, and steady yourself until you feel okay. In the morning, first sit up on the side of the bed. When you feel okay, stand up slowly while you hold onto something. Do this until you know that your balance is okay. If you need to stand in one place for a long time, move your legs often. Tighten and relax the muscles in your legs while you are standing. Do not drive or use machinery if you feel dizzy. Avoid bending down if you feel dizzy. Place items in your home so you can reach them easily without leaning over. Lifestyle Do not smoke or use any products that contain nicotine or tobacco. If you need help quitting, ask your doctor. Try to lower your stress level. You can do this by using methods such as yoga or meditation. Talk with your doctor if you need help. General  instructions Watch your dizziness for any changes. Take over-the-counter and prescription medicines only as told by your doctor. Talk with your doctor if you think that you are dizzy because of a medicine that you are taking. Tell a friend or a family member that you are feeling dizzy. If he or she notices any changes in your behavior, have this person call your doctor. Keep all follow-up visits. Contact a doctor if: Your dizziness does not go away. Your dizziness or light-headedness gets worse. You feel like you may vomit (are nauseous). You have trouble hearing. You have new symptoms. You are unsteady on your feet. You feel like the room is spinning. You have neck pain or a stiff neck. You have a fever. Get help right away if: You vomit or have watery poop (diarrhea), and you cannot eat or drink anything. You have trouble: Talking. Walking. Swallowing. Using your arms, hands, or legs. You feel generally weak. You are not thinking clearly, or you have trouble forming sentences. A friend or family member may notice this. You have: Chest pain. Pain in your belly (abdomen). Shortness of breath. Sweating. Your vision changes. You are bleeding. You have a very bad headache. These symptoms may be an emergency. Get help right away. Call your local emergency services (911 in the [place] Do not wait to see if the symptoms will go away. Do not drive yourself to the hospital. Summary Dizziness  makes you feel unsteady or light-headed. You may feel like you are about to pass out (faint). Drink enough fluid to keep your pee (urine) pale yellow. Do not drink alcohol. Avoid making quick movements if you feel dizzy. Watch your dizziness for any changes. This information is not intended to replace advice given to you by your health care provider. Make sure you discuss any questions you have with your health care provider. Document Revised: 09/03/2020 Document Reviewed: 09/06/2020 Elsevier  Patient Education  2024 [employer].

## 2023-03-13 NOTE — Progress Notes (Signed)
6 MONTH FOLLOW UP  Assessment and Plan:  Sean Vasquez was seen today for follow-up.  Diagnoses and all orders for this visit:  Essential hypertension Elevated in clinic Continue Ziac and Olmesartan Discussed DASH (Dietary Approaches to Stop Hypertension) DASH diet is lower in sodium than a typical American diet. Cut back on foods that are high in saturated fat, cholesterol, and trans fats. Eat more whole-grain foods, fish, poultry, and nuts Remain active and exercise as tolerated daily.  Monitor BP at home-Call if greater than 130/80.  Check CMP/CBC       Hyperlipidemia, mixed Continue Zetia Discussed lifestyle modifications. Recommended diet heavy in fruits and veggies, omega 3's. Decrease consumption of animal meats, cheeses, and dairy products. Remain active and exercise as tolerated. Continue to monitor. Check lipids/TSH  Testosterone deficiency Continue Testosterone 100mg  1 ml IM q week - effective Continue to monitor  Obesity (BMI 30.0-34.9) Discussed appropriate BMI Diet modification. Physical activity. Encouraged/praised to build confidence.  Osteoarthritis of fingers of both hands Continue Mobic RICE when flared  Osteoarthritis of first metatarsophalangeal joint Continue corn pad around area on right great toe to avoid continuous pressure on area from shoe  Gastroesophageal reflux disease, unspecified whether esophagitis present No suspected reflux complications (Barret/stricture). Lifestyle modification:  wt loss, avoid meals 2-3h before bedtime. Consider eliminating food triggers:  chocolate, caffeine, EtOH, acid/spicy food. If he develops shortness of breath, orthopnea, chest pain described as crushing or pressure, jaw pain, sweating or nausea he is to go to the ER.  Other Chest pain EKG - NSR  Vertigo Start Meclizine Suggest ear wash recipe if s/s fail to improve refer to ENT or PT  Orders Placed This Encounter  Procedures   CBC with  Differential/Platelet   COMPLETE METABOLIC PANEL WITH GFR   Lipid panel   EKG 12-Lead    Notify office for further evaluation and treatment, questions or concerns if any reported s/s fail to improve.   The patient was advised to call back or seek an in-person evaluation if any symptoms worsen or if the condition fails to improve as anticipated.   Further disposition pending results of labs. Discussed med's effects and SE's.    I discussed the assessment and treatment plan with the patient. The patient was provided an opportunity to ask questions and all were answered. The patient agreed with the plan and demonstrated an understanding of the instructions.  Discussed med's effects and SE's. Screening labs and tests as requested with regular follow-up as recommended.  I provided 25 minutes of face-to-face time during this encounter including counseling, chart review, and critical decision making was preformed.  Today's Plan of Care is based on a patient-centered health care approach known as shared decision making - the decisions, tests and treatments allow for patient preferences and values to be balanced with clinical evidence.     Future Appointments  Date Time Provider Department Center  06/19/2023  3:30 PM Lucky Cowboy, MD [facility] None  12/12/2023  3:00 PM Lucky Cowboy, MD [facility] None    ----------------------------------------------------------------------------------------------------------------------  HPI 62 y.o. male  presents for a general follow up on hypertension, cholesterol, glucose management, obesity, hypogonadism and vitamin D deficiency.   Shares today that he has been experiencing symptoms of vertigo.  Onset today.  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  He was able to stand which help subside the symptom however, when he went back to the same position, lying and turing head from side  to side the symptoms  returned with nausea.  He denies vomiting.  States this has occurred once in the past approximately 3 months ago.  State that it could be contributed to an ear wash of the left ear.    He also reports left sided substernal CP, however, reports this may be due to a pulled muscle.  Has tenderness to palpation along the left pectoral muscle.  He endorses chest pain that feels internal not external.  Denies radiation down arm, difficulty breathing, syncope.    Of note, he was treated for a tick bite 03/01/23 and has completed Doxycycline.  BMI is Body mass index is 32.89 kg/m., he has been working on diet, reducing red meat and dairy, works a physically intense job.  Wt Readings from Last 3 Encounters:  03/13/23 203 lb 12.8 oz (92.4 kg)  03/01/23 209 lb (94.8 kg)  12/05/22 210 lb (95.3 kg)   His blood pressure has been controlled at home however elevated in clinic (120-128/70-80s, checks 3-4 days a week),  today their BP is BP: (!) 150/98 BP Readings from Last 3 Encounters:  03/13/23 (!) 150/98  03/01/23 130/80  12/05/22 136/84     He does not workout but works physically intense job. He denies chest pain, shortness of breath, dizziness.   He is on cholesterol medication .He had side effects with statins, fenofibrate. He is currently on Zetia 10 mg QD.  He is on a omega 3 supplement. His cholesterol is not at goal. The cholesterol last visit was:   Lab Results  Component Value Date   CHOL 188 12/05/2022   HDL 27 (L) 12/05/2022   LDLCALC 117 (H) 12/05/2022   TRIG 286 (H) 12/05/2022   CHOLHDL 7.0 (H) 12/05/2022   He has not been working on diet and exercise .  He states in the past month he has been eating more salt and red meat, saturated fats.  Last A1C in the office was:  Lab Results  Component Value Date   HGBA1C 6.2 (H) 12/05/2022    Last GFR:  Lab Results  Component Value Date   GFRNONAA 67 11/29/2020   He has a history of testosterone deficiency and is on testosterone  replacement, taking 100 mg weekly. He states that the testosterone helps with his energy, libido, muscle mass. Lab Results  Component Value Date   TESTOSTERONE 2,272 (H) 12/05/2022      Current Medications:  Current Outpatient Medications on File Prior to Visit  Medication Sig   aspirin 81 MG chewable tablet Chew 81 mg by mouth daily.   bisoprolol-hydrochlorothiazide (ZIAC) 10-6.25 MG tablet Take 1 tablet by mouth once daily for blood pressure   Cholecalciferol (VITAMIN D PO) Take 2,000 Units by mouth. Takes 8000 units daily   ezetimibe (ZETIA) 10 MG tablet Take  1 tablet  Daily  for Cholesterol   fish oil-omega-3 fatty acids 1000 MG capsule Take 1 g by mouth daily.   hydrocortisone (ANUSOL-HC) 2.5 % rectal cream Apply Rectally 3 to 4 x day   Magnesium 500 MG TABS Take 1 tablet by mouth daily.   Multiple Vitamin (MULTIVITAMIN) tablet Take 1 tablet by mouth daily.   NEEDLE, DISP, 21 G 21G X 1" MISC Use for testosterone injections, 3 ml   olmesartan (BENICAR) 40 MG tablet Take  1 tablet  Daily for BP                                                                            /  Take                                                   by                                           mouth                                             once?daily   testosterone cypionate (DEPOTESTOSTERONE CYPIONATE) 200 MG/ML injection Inject  1 Ml (200 mg)  into Muscle  every 7 days   zinc gluconate 50 MG tablet Take 50 mg by mouth daily.   doxycycline (VIBRAMYCIN) 100 MG capsule Take 1 capsule twice daily with food   Flaxseed, Linseed, (FLAXSEED OIL) 1000 MG CAPS Take by mouth daily. (Patient not taking: Reported on 03/13/2023)   No current facility-administered medications on file prior to visit.     Allergies:  Allergies  Allergen Reactions   Pravastatin Sodium Other (See Comments)    myalgias   Crestor [Rosuvastatin]     Elevated CPK   Lipitor  [Atorvastatin]     Myalgias   Tricor [Fenofibrate]     Myalgias   Zocor [Simvastatin]     Myalgias      Medical History:  Past Medical History:  Diagnosis Date   Allergy    Hemorrhoids    Hyperlipidemia    Hypertension    Kidney stone    Testosterone deficiency    Vitamin D deficiency    Family history- Reviewed and unchanged Social history- Reviewed and unchanged   Review of Systems:  Review of Systems  Constitutional:  Negative for chills, fever, malaise/fatigue and weight loss.  HENT:  Negative for congestion, hearing loss and tinnitus.   Eyes:  Negative for blurred vision and double vision.  Respiratory:  Negative for cough, shortness of breath and wheezing.   Cardiovascular:  Positive for chest pain. Negative for palpitations, orthopnea, claudication and leg swelling.  Gastrointestinal:  Negative for abdominal pain, blood in stool, constipation, diarrhea, heartburn, melena, nausea and vomiting.  Genitourinary: Negative.   Musculoskeletal:  Positive for joint pain (fingers and toes). Negative for falls and myalgias.  Skin:  Negative for rash.  Neurological:  Negative for dizziness, tingling, tremors, sensory change, loss of consciousness, weakness and headaches.  Endo/Heme/Allergies:  Negative for polydipsia.  Psychiatric/Behavioral: Negative.  Negative for depression, memory loss and suicidal ideas.   All other systems reviewed and are negative.     Physical Exam: BP (!) 150/98   Pulse 60   Temp 97.6 F (36.4 C)   Ht 5\' 6"  (1.676 m)   Wt 203 lb 12.8 oz (92.4 kg)   SpO2 99%   BMI 32.89 kg/m  Wt Readings from Last 3 Encounters:  03/13/23 203 lb 12.8 oz (92.4 kg)  03/01/23 209 lb (94.8 kg)  12/05/22 210 lb (95.3 kg)   General Appearance: Well nourished, in no apparent distress. Eyes: PERRLA, EOMs, conjunctiva no swelling or erythema Sinuses: No Frontal/maxillary tenderness ENT/Mouth: Ext aud canals obstructed by hair growth; no apparent cerumen, TMs  cannot be visualized. Mask  in place; oral exam deferred. Hearing normal.  Neck: Supple, thyroid normal.  Respiratory: Respiratory effort normal, BS equal bilaterally without rales, rhonchi, wheezing or stridor.  Cardio: RRR with no MRGs. Brisk peripheral pulses without edema.  Abdomen: Soft, + BS.  Non tender, no guarding, rebound, hernias, masses. Lymphatics: Non tender without lymphadenopathy.  Musculoskeletal: Tender to palpation along left pectoral  muscle.  Full ROM, 5/5 strength, Normal gait Skin: Warm, dry without rashes, lesions, ecchymosis.  Neuro: Cranial nerves intact. No cerebellar symptoms.  Psych: Awake and oriented X 3, normal affect, Insight and Judgment appropriate.   EKG:  NSR  Adela Glimpse, NP 3:59 PM [facility]

## 2023-03-14 LAB — COMPLETE METABOLIC PANEL WITH GFR
AG Ratio: 2 (calc) (ref 1.0–2.5)
ALT: 65 U/L — ABNORMAL HIGH (ref 9–46)
AST: 45 U/L — ABNORMAL HIGH (ref 10–35)
Albumin: 4.8 g/dL (ref 3.6–5.1)
Alkaline phosphatase (APISO): 59 U/L (ref 35–144)
BUN/Creatinine Ratio: 16 (calc) (ref 6–22)
BUN: 22 mg/dL (ref 7–25)
CO2: 26 mmol/L (ref 20–32)
Calcium: 10.3 mg/dL (ref 8.6–10.3)
Chloride: 102 mmol/L (ref 98–110)
Creat: 1.34 mg/dL — ABNORMAL HIGH (ref 0.70–1.30)
Globulin: 2.4 g/dL (calc) (ref 1.9–3.7)
Glucose, Bld: 79 mg/dL (ref 65–99)
Potassium: 5.2 mmol/L (ref 3.5–5.3)
Sodium: 141 mmol/L (ref 135–146)
Total Bilirubin: 0.6 mg/dL (ref 0.2–1.2)
Total Protein: 7.2 g/dL (ref 6.1–8.1)
eGFR: 61 mL/min/{1.73_m2} (ref >=60)

## 2023-03-14 LAB — CBC WITH DIFFERENTIAL/PLATELET
Absolute Monocytes: 543 cells/uL (ref 200–950)
Basophils Absolute: 73 cells/uL (ref 0–200)
Basophils Relative: 0.9 %
Eosinophils Absolute: 178 cells/uL (ref 15–500)
Eosinophils Relative: 2.2 %
HCT: 50.8 % — ABNORMAL HIGH (ref 38.5–50.0)
Hemoglobin: 17.6 g/dL — ABNORMAL HIGH (ref 13.2–17.1)
Lymphs Abs: 2260 cells/uL (ref 850–3900)
MCH: 30.1 pg (ref 27.0–33.0)
MCHC: 34.6 g/dL (ref 32.0–36.0)
MCV: 87 fL (ref 80.0–100.0)
MPV: 10.8 fL (ref 7.5–12.5)
Monocytes Relative: 6.7 %
Neutro Abs: 5046 cells/uL (ref 1500–7800)
Neutrophils Relative %: 62.3 %
Platelets: 222 10*3/uL (ref 140–400)
RBC: 5.84 10*6/uL — ABNORMAL HIGH (ref 4.20–5.80)
RDW: 13.2 % (ref 11.0–15.0)
Total Lymphocyte: 27.9 %
WBC: 8.1 10*3/uL (ref 3.8–10.8)

## 2023-03-14 LAB — LIPID PANEL
Cholesterol: 195 mg/dL (ref <200)
HDL: 29 mg/dL — ABNORMAL LOW (ref >=40)
LDL Cholesterol (Calc): 119 mg/dL (calc) — ABNORMAL HIGH
Non-HDL Cholesterol (Calc): 166 mg/dL (calc) — ABNORMAL HIGH (ref <130)
Total CHOL/HDL Ratio: 6.7 (calc) — ABNORMAL HIGH (ref <5.0)
Triglycerides: 328 mg/dL — ABNORMAL HIGH (ref <150)

## 2023-06-05 ENCOUNTER — Other Ambulatory Visit: Payer: Self-pay | Admitting: Nurse Practitioner

## 2023-06-05 DIAGNOSIS — I10 Essential (primary) hypertension: Secondary | ICD-10-CM

## 2023-06-13 ENCOUNTER — Ambulatory Visit: Payer: Managed Care, Other (non HMO) | Admitting: Internal Medicine

## 2023-06-18 ENCOUNTER — Encounter: Payer: Self-pay | Admitting: Internal Medicine

## 2023-06-18 NOTE — Progress Notes (Unsigned)
Future Appointments  Date Time Provider Department  06/19/2023                  6 mo ov  3:30 PM Lucky Cowboy, MD [facility]  12/17/2023                  cpe  3:00 PM Lucky Cowboy, MD [facility]    History of Present Illness:       This very nice 62 y.o.  single WM  presents for 3 month follow up with HTN, HLD, Pre-Diabetes and Vitamin D Deficiency.         Patient is treated for HTN  (2005) & BP has been controlled at home. Today's BP isat goal - 138/84. Patient has had no complaints of any cardiac type chest pain, palpitations, dyspnea / orthopnea / PND, dizziness, claudication, or dependent edema.        Hyperlipidemia is not controlled with diet & Ezetimibe. Patient denies myalgias or other med SE's. Last Lipids were not at goal :  Lab Results  Component Value Date   CHOL 195 03/13/2023   HDL 29 (L) 03/13/2023   LDLCALC 119 (H) 03/13/2023   TRIG 328 (H) 03/13/2023   CHOLHDL 6.7 (H) 03/13/2023     Also, the patient has history of PreDiabetes w /Insulin Resistance (A1c 5.0% /Insulin 36 /2015) and has had no symptoms of reactive hypoglycemia, diabetic polys, paresthesias or visual blurring.  Last A1c was not at goal :  Lab Results  Component Value Date   HGBA1C 6.2 (H) 12/05/2022                                                       Further, the patient also has history of Vitamin D Deficiency ("23"/2008) and supplements vitamin D . Last vitamin D was at goal :  Lab Results  Component Value Date   VD25OH 81 12/05/2022     Current Outpatient Medications on File Prior to Visit  Medication Sig   aspirin 81 MG ablet daily.   bisoprolol-hctz 10-6.25 MG tablet Take 1 tablet  daily for    VITAMIN D     Takes 8000 units daily   Ezetimibe 10 MG tablet Take  1 tablet  Daily    fish oil-omega 3   1000 MG capsule Take daily.   ANUSOL-HC   2.5 % rectal cream Apply Rectally 3 to 4 x day   Magnesium 500 MG TABS Take 1 tablet  daily.   meclizine 25 MG tablet 1/2-1  pill up to 3 times daily    Multiple Vitamin  Take 1 tablet  daily.   olmesartan 40 MG tablet Take  1 tablet  Daily   testosterone cypio 200 MG/ML injec Inject  1 Ml   into Muscle  every 7 days   zinc 50 MG tablet Take daily.    Allergies  Allergen Reactions   Pravastatin Sodium Other (See Comments)    myalgias   Crestor [Rosuvastatin]     Elevated CPK   Lipitor [Atorvastatin]     Myalgias   Tricor [Fenofibrate]     Myalgias   Zocor [Simvastatin]     Myalgias     PMHx:   Past Medical History:  Diagnosis Date   Allergy    Hemorrhoids  Hyperlipidemia    Hypertension    Kidney stone    Testosterone deficiency    Vitamin D deficiency      Immunization History  Administered Date(s) Administered   DT (Pediatric) 10/19/2003   Influenza Split 07/10/2013   PPD Test 03/12/2018, 12/05/2022   Pneumococcal Polysaccharide-23 03/03/2009   Td 03/12/2018     Past Surgical History:  Procedure Laterality Date   BASAL CELL CARCINOMA EXCISION     rt. eyelid   COLONOSCOPY  12/12/2011   Procedure: COLONOSCOPY;  Surgeon: Iva Boop, MD;  Location: [facility];  Service: Endoscopy;  Laterality: N/A;  with possible hemorrhoid banding   WISDOM TOOTH EXTRACTION  1999    FHx:    Reviewed / unchanged   SHx:    Reviewed / unchanged    Systems Review:  Constitutional: Denies fever, chills, wt changes, headaches, insomnia, fatigue, night sweats, change in appetite. Eyes: Denies redness, blurred vision, diplopia, discharge, itchy, watery eyes.  ENT: Denies discharge, congestion, post nasal drip, epistaxis, sore throat, earache, hearing loss, dental pain, tinnitus, vertigo, sinus pain, snoring.  CV: Denies chest pain, palpitations, irregular heartbeat, syncope, dyspnea, diaphoresis, orthopnea, PND, claudication or edema. Respiratory: denies cough, dyspnea, DOE, pleurisy, hoarseness, laryngitis, wheezing.  Gastrointestinal: Denies dysphagia, odynophagia, heartburn, reflux, water  brash, abdominal pain or cramps, nausea, vomiting, bloating, diarrhea, constipation, hematemesis, melena, hematochezia  or hemorrhoids. Genitourinary: Denies dysuria, frequency, urgency, nocturia, hesitancy, discharge, hematuria or flank pain. Musculoskeletal: Denies arthralgias, myalgias, stiffness, jt. swelling, pain, limping or strain/sprain.  Skin: Denies pruritus, rash, hives, warts, acne, eczema or change in skin lesion(s). Neuro: No weakness, tremor, incoordination, spasms, paresthesia or pain. Psychiatric: Denies confusion, memory loss or sensory loss. Endo: Denies change in weight, skin or hair change.  Heme/Lymph: No excessive bleeding, bruising or enlarged lymph nodes.   Physical Exam  BP (!) 138/84   Pulse 73   Temp (!) 97.4 F (36.3 C)   Ht 5\' 6"  (1.676 m)   Wt 202 lb 9.6 oz (91.9 kg)   SpO2 97%   BMI 32.70 kg/m   Appears  well nourished, well groomed  and in no distress.  Eyes: PERRLA, EOMs, conjunctiva no swelling or erythema. Sinuses: No frontal/maxillary tenderness ENT/Mouth: EAC's clear, TM's nl w/o erythema, bulging. Nares clear w/o erythema, swelling, exudates. Oropharynx clear without erythema or exudates. Oral hygiene is good. Tongue normal, non obstructing. Hearing intact.  Neck: Supple. Thyroid not palpable. Car 2+/2+ without bruits, nodes or JVD. Chest: Respirations nl with BS clear & equal w/o rales, rhonchi, wheezing or stridor.  Cor: Heart sounds normal w/ regular rate and rhythm without sig. murmurs, gallops, clicks or rubs. Peripheral pulses normal and equal  without edema.  Abdomen: Soft & bowel sounds normal. Non-tender w/o guarding, rebound, hernias, masses or organomegaly.  Lymphatics: Unremarkable.  Musculoskeletal: Full ROM all peripheral extremities, joint stability, 5/5 strength and normal gait.  Skin: Warm, dry without exposed rashes, lesions or ecchymosis apparent.  Neuro: Cranial nerves intact, reflexes equal bilaterally. Sensory-motor  testing grossly intact. Tendon reflexes grossly intact.  Pysch: Alert & oriented x 3.  Insight and judgement nl & appropriate. No ideations.   Assessment and Plan:   1. Essential hypertension  - CBC with Differential/Platelet - COMPLETE METABOLIC PANEL WITH GFR - Magnesium - TSH  - Continue medication, monitor blood pressure at home.  - Continue DASH diet.  Reminder to go to the ER if any CP,  SOB, nausea, dizziness, severe HA, changes vision/speech.  2. Hyperlipidemia, mixed  - Continue diet/meds, exercise,& lifestyle modifications.  - Continue monitor periodic cholesterol/liver & renal functions     - Lipid panel - TSH   3. Abnormal glucose  - Continue diet, exercise  - Lifestyle modifications.  - Monitor appropriate labs    - Hemoglobin A1c - Insulin, random   4. Vitamin D deficiency  - Continue supplementation   - VITAMIN D 25 Hydroxy   5. Testosterone deficiency  - Testosterone   6. Medication management  - CBC with Differential/Platelet - COMPLETE METABOLIC PANEL WITH GFR - Magnesium - Lipid panel - TSH - Hemoglobin A1c - Insulin, random - VITAMIN D 25 Hydroxy - Testosterone        Discussed  regular exercise, BP monitoring, weight control to achieve/maintain BMI less than 25 and discussed med and SE's. Recommended labs to assess /monitor clinical status .  I discussed the assessment and treatment plan with the patient. The patient was provided an opportunity to ask questions and all were answered. The patient agreed with the plan and demonstrated an understanding of the instructions.  I provided over 30 minutes of exam, counseling, chart review and  complex critical decision making.        The patient was advised to call back or seek an in-person evaluation if the symptoms worsen or if the condition fails to improve as anticipated.   Marinus Maw, MD

## 2023-06-18 NOTE — Patient Instructions (Signed)

## 2023-06-19 ENCOUNTER — Encounter: Payer: Self-pay | Admitting: Internal Medicine

## 2023-06-19 ENCOUNTER — Ambulatory Visit: Payer: Managed Care, Other (non HMO) | Admitting: Internal Medicine

## 2023-06-19 VITALS — BP 138/84 | HR 73 | Temp 97.4°F | Ht 66.0 in | Wt 202.6 lb

## 2023-06-19 DIAGNOSIS — E782 Mixed hyperlipidemia: Secondary | ICD-10-CM | POA: Diagnosis not present

## 2023-06-19 DIAGNOSIS — I10 Essential (primary) hypertension: Secondary | ICD-10-CM | POA: Diagnosis not present

## 2023-06-19 DIAGNOSIS — R7309 Other abnormal glucose: Secondary | ICD-10-CM

## 2023-06-19 DIAGNOSIS — E559 Vitamin D deficiency, unspecified: Secondary | ICD-10-CM

## 2023-06-19 DIAGNOSIS — Z79899 Other long term (current) drug therapy: Secondary | ICD-10-CM | POA: Diagnosis not present

## 2023-06-19 DIAGNOSIS — E349 Endocrine disorder, unspecified: Secondary | ICD-10-CM

## 2023-06-19 MED ORDER — EZETIMIBE 10 MG PO TABS: ORAL_TABLET | ORAL | 3 refills | Status: AC

## 2023-06-20 LAB — CBC WITH DIFFERENTIAL/PLATELET
Absolute Monocytes: 504 {cells}/uL (ref 200–950)
Basophils Absolute: 42 {cells}/uL (ref 0–200)
Basophils Relative: 0.6 %
Eosinophils Absolute: 203 {cells}/uL (ref 15–500)
Eosinophils Relative: 2.9 %
HCT: 48.5 % (ref 38.5–50.0)
Hemoglobin: 16.8 g/dL (ref 13.2–17.1)
Lymphs Abs: 1743 {cells}/uL (ref 850–3900)
MCH: 30.4 pg (ref 27.0–33.0)
MCHC: 34.6 g/dL (ref 32.0–36.0)
MCV: 87.9 fL (ref 80.0–100.0)
MPV: 11.2 fL (ref 7.5–12.5)
Monocytes Relative: 7.2 %
Neutro Abs: 4508 {cells}/uL (ref 1500–7800)
Neutrophils Relative %: 64.4 %
Platelets: 218 10*3/uL (ref 140–400)
RBC: 5.52 10*6/uL (ref 4.20–5.80)
RDW: 13.1 % (ref 11.0–15.0)
Total Lymphocyte: 24.9 %
WBC: 7 10*3/uL (ref 3.8–10.8)

## 2023-06-20 LAB — VITAMIN D 25 HYDROXY (VIT D DEFICIENCY, FRACTURES): Vit D, 25-Hydroxy: 76 ng/mL (ref 30–100)

## 2023-06-20 LAB — HEMOGLOBIN A1C
Hgb A1c MFr Bld: 5.8 %{Hb} — ABNORMAL HIGH (ref <5.7)
Mean Plasma Glucose: 120 mg/dL
eAG (mmol/L): 6.6 mmol/L

## 2023-06-20 LAB — LIPID PANEL
Cholesterol: 160 mg/dL (ref <200)
HDL: 26 mg/dL — ABNORMAL LOW (ref >=40)
Non-HDL Cholesterol (Calc): 134 mg/dL — ABNORMAL HIGH (ref <130)
Total CHOL/HDL Ratio: 6.2 (calc) — ABNORMAL HIGH (ref <5.0)
Triglycerides: 434 mg/dL — ABNORMAL HIGH (ref <150)

## 2023-06-20 LAB — COMPLETE METABOLIC PANEL WITH GFR
AG Ratio: 2.2 (calc) (ref 1.0–2.5)
ALT: 55 U/L — ABNORMAL HIGH (ref 9–46)
AST: 34 U/L (ref 10–35)
Albumin: 4.4 g/dL (ref 3.6–5.1)
Alkaline phosphatase (APISO): 54 U/L (ref 35–144)
BUN: 22 mg/dL (ref 7–25)
CO2: 29 mmol/L (ref 20–32)
Calcium: 9.9 mg/dL (ref 8.6–10.3)
Chloride: 103 mmol/L (ref 98–110)
Creat: 1.26 mg/dL (ref 0.70–1.35)
Globulin: 2 g/dL (ref 1.9–3.7)
Glucose, Bld: 112 mg/dL — ABNORMAL HIGH (ref 65–99)
Potassium: 4.6 mmol/L (ref 3.5–5.3)
Sodium: 140 mmol/L (ref 135–146)
Total Bilirubin: 0.4 mg/dL (ref 0.2–1.2)
Total Protein: 6.4 g/dL (ref 6.1–8.1)
eGFR: 65 mL/min/{1.73_m2} (ref >=60)

## 2023-06-20 LAB — TSH: TSH: 3.08 m[IU]/L (ref 0.40–4.50)

## 2023-06-20 LAB — TESTOSTERONE: Testosterone: 666 ng/dL (ref 250–827)

## 2023-06-20 LAB — MAGNESIUM: Magnesium: 2 mg/dL (ref 1.5–2.5)

## 2023-06-20 LAB — INSULIN, RANDOM: Insulin: 70.9 u[IU]/mL — ABNORMAL HIGH

## 2023-06-20 NOTE — Progress Notes (Signed)
<>*<>*<>*<>*<>*<>*<>*<>*<>*<>*<>*<>*<>*<>*<>*<>*<>*<>*<>*<>*<>*<>*<>*<>*<> <>*<>*<>*<>*<>*<>*<>*<>*<>*<>*<>*<>*<>*<>*<>*<>*<>*<>*<>*<>*<>*<>*<>*<>*<>  -    Chol = 160  -  Excellent   - Very low risk for Heart Attack  / Stroke  <>*<>*<>*<>*<>*<>*<>*<>*<>*<>*<>*<>*<>*<>*<>*<>*<>*<>*<>*<>*<>*<>*<>*<>*<>  - But . . .  . . Triglycerides ( = 434  ) or fats in blood are way too high                 (   Ideal or  Goal is less than 150  !  )    - Recommend avoid fried & greasy foods,  sweets / candy,   - Avoid white rice  (brown or wild rice or Quinoa is OK),   - Avoid white potatoes  (sweet potatoes are OK)   - Avoid anything made from white flour  - bagels, doughnuts, rolls, buns, biscuits, white and   wheat breads, pizza crust and traditional  pasta made of white flour & egg white  - (vegetarian pasta or spinach or wheat pasta is OK).    - Multi-grain bread is OK - like multi-grain flat bread or  sandwich thins.   - Avoid alcohol in excess.   - Exercise is also important.  <>*<>*<>*<>*<>*<>*<>*<>*<>*<>*<>*<>*<>*<>*<>*<>*<>*<>*<>*<>*<>*<>*<>*<>*<>  -  A1c = 5.8%  Blood sugar and A1c are STILL  elevated in the borderline and                                                         early or pre-diabetes range which has the same   300% increased risk for heart attack, stroke, cancer and                                             alzheimer- type vascular dementia as full blown diabetes.   But the good news is that diet, exercise with weight loss can                                                                                cure the early diabetes at this point.  <>*<>*<>*<>*<>*<>*<>*<>*<>*<>*<>*<>*<>*<>*<>*<>*<>*<>*<>*<>*<>*<>*<>*<>*<>  -  Vitamin D = 76  - Excellent   - Please keep dosage same   <>*<>*<>*<>*<>*<>*<>*<>*<>*<>*<>*<>*<>*<>*<>*<>*<>*<>*<>*<>*<>*<>*<>*<>*<>  -  Testosterone Normal & OK    <>*<>*<>*<>*<>*<>*<>*<>*<>*<>*<>*<>*<>*<>*<>*<>*<>*<>*<>*<>*<>*<>*<>*<>*<>  -  All Else - CBC - Kidneys - Electrolytes - Liver - Magnesium & Thyroid    - all  Normal / OK  <>*<>*<>*<>*<>*<>*<>*<>*<>*<>*<>*<>*<>*<>*<>*<>*<>*<>*<>*<>*<>*<>*<>*<>*<> <>*<>*<>*<>*<>*<>*<>*<>*<>*<>*<>*<>*<>*<>*<>*<>*<>*<>*<>*<>*<>*<>*<>*<>*<>

## 2023-07-03 ENCOUNTER — Other Ambulatory Visit: Payer: Self-pay | Admitting: Internal Medicine

## 2023-07-03 DIAGNOSIS — I10 Essential (primary) hypertension: Secondary | ICD-10-CM

## 2023-07-05 ENCOUNTER — Encounter: Payer: Self-pay | Admitting: Nurse Practitioner

## 2023-07-05 ENCOUNTER — Ambulatory Visit (INDEPENDENT_AMBULATORY_CARE_PROVIDER_SITE_OTHER): Payer: Managed Care, Other (non HMO) | Admitting: Nurse Practitioner

## 2023-07-05 VITALS — BP 136/80 | HR 71 | Temp 98.0°F | Resp 16 | Ht 66.0 in | Wt 202.6 lb

## 2023-07-05 DIAGNOSIS — M79641 Pain in right hand: Secondary | ICD-10-CM | POA: Diagnosis not present

## 2023-07-05 DIAGNOSIS — M19079 Primary osteoarthritis, unspecified ankle and foot: Secondary | ICD-10-CM | POA: Diagnosis not present

## 2023-07-05 DIAGNOSIS — L539 Erythematous condition, unspecified: Secondary | ICD-10-CM | POA: Diagnosis not present

## 2023-07-05 DIAGNOSIS — Z79899 Other long term (current) drug therapy: Secondary | ICD-10-CM

## 2023-07-05 DIAGNOSIS — R2231 Localized swelling, mass and lump, right upper limb: Secondary | ICD-10-CM | POA: Diagnosis not present

## 2023-07-05 DIAGNOSIS — Z8261 Family history of arthritis: Secondary | ICD-10-CM

## 2023-07-05 MED ORDER — DOXYCYCLINE HYCLATE 100 MG PO TABS
100.0000 mg | ORAL_TABLET | Freq: Two times a day (BID) | ORAL | 0 refills | Status: AC
Start: 2023-07-05 — End: 2023-07-12

## 2023-07-05 MED ORDER — PREDNISONE 10 MG PO TABS
ORAL_TABLET | ORAL | 0 refills | Status: DC
Start: 2023-07-05 — End: 2023-09-18

## 2023-07-05 NOTE — Patient Instructions (Signed)
Arthritis Arthritis means joint pain. It can also mean joint disease. A joint is a place where bones come together. There are more than 100 types of arthritis. What are the causes? Wear and tear of a joint. This is the most common cause. Too much of a chemical called uric acid in the blood, which leads to pain in the joint (gout). Pain and swelling (inflammation) in a joint. Infection of a joint. Injuries in the joint. A reaction to medicines (allergy). In some cases, the cause may not be known. What are the signs or symptoms? Pain in a joint when moving. Redness at a joint. Swelling at a joint. Stiffness at a joint. Warmth coming from the joint. A fever. A feeling of being sick. How is this treated? This condition may be treated with: Treating the cause, if it is known. Rest. Raising (elevating) the joint. Putting cold or hot packs on the joint. Medicines to treat symptoms and reduce pain and swelling. Shots (injections) of medicines, such as cortisone, into the joint. You may also be told to make changes in your life, such as doing exercises and losing weight. Follow these instructions at home: Medicines Take over-the-counter and prescription medicines only as told by your doctor. Do not take aspirin for pain if your doctor says that you may have gout. Activity Rest your joint if your doctor tells you to. Avoid activities that make the pain worse. Exercise your joint regularly as told by your doctor. Try doing exercises like: Swimming. Water aerobics. Biking. Walking. Managing pain, stiffness, and swelling     If told, put ice on the affected area. To do this: Put ice in a plastic bag. Place a towel between your skin and the bag. Leave the ice on for 20 minutes, 2-3 times a day. Take off the ice if your skin turns bright red. This is very important. If you cannot feel pain, heat, or cold, you have a greater risk of damage to the area. If your joint is swollen, raise  (elevate) it above the level of your heart if told by your doctor. If your joint feels stiff in the morning, try taking a warm shower. If told, put heat on the affected area. Do this as often as told by your doctor. Use the heat source that your doctor recommends, such as a moist heat pack or a heating pad. If you have diabetes, do not apply heat without asking your doctor. To apply heat: Place a towel between your skin and the heat source. Leave the heat on for 20-30 minutes. Take off the heat if your skin turns bright red. This is very important. If you cannot feel pain, heat, or cold, you have a greater risk of getting burned. General instructions Maintain a healthy weight. Follow instructions from your doctor for weight control. Do not smoke or use any products that contain nicotine or tobacco. If you need help quitting, ask your doctor. Keep all follow-up visits. Where to find more information [employer]: www.niams.http://www.myers.net/ Contact a doctor if: The pain gets worse. You have a fever. Get help right away if: You have very bad pain in your joint. You have swelling in your joint. Your joint is red. Many joints become painful and swollen. You have very bad back pain. Your leg is very weak. Summary Arthritis means joint pain. It can also mean joint disease. A joint is a place where bones come together. The most common cause of this condition is wear and  tear of a joint. Symptoms of this condition include redness, swelling, or stiffness of the joint. This condition is treated with rest, raising the joint, medicines, and putting cold or hot packs on the joint. Follow your doctor's instructions about medicines, activity, exercises, and other home care treatments. This information is not intended to replace advice given to you by your health care provider. Make sure you discuss any questions you have with your health care provider. Document Revised: 07/12/2021 Document  Reviewed: 07/12/2021 Elsevier Patient Education  2024 [employer]    Gout  Gout is painful swelling of your joints. Gout is a type of arthritis. It is caused by having too much uric acid in your body. Uric acid is a chemical that is made when your body breaks down substances called purines. If your body has too much uric acid, sharp crystals can form and build up in your joints. This causes pain and swelling. Gout attacks can happen quickly and be very painful (acute gout). Over time, the attacks can affect more joints and happen more often (chronic gout). What are the causes? Gout is caused by too much uric acid in your blood. This can happen because: Your kidneys do not remove enough uric acid from your blood. Your body makes too much uric acid. You eat too many foods that are high in purines. These foods include organ meats, some seafood, and beer. Trauma or stress can bring on an attack. What increases the risk? Having a family history of gout. Being male and middle-aged. Being male and having gone through menopause. Having an organ transplant. Taking certain medicines. Having certain conditions, such as: Being very overweight (obese). Lead poisoning. Kidney disease. A skin condition called psoriasis. Other risks include: Losing weight too quickly. Not having enough water in the body (being dehydrated). Drinking alcohol, especially beer. Drinking beverages that are sweetened with a type of sugar called fructose. What are the signs or symptoms? An attack of acute gout often starts at night and usually happens in just one joint. The most common place is the big toe. Other joints that may be affected include joints of the feet, ankle, knee, fingers, wrist, or elbow. Symptoms may include: Very bad pain. Warmth. Swelling. Stiffness. Tenderness. The affected joint may be very painful to touch. Shiny, red, or purple skin. Chills and fever. Chronic gout may cause symptoms  more often. More joints may be involved. You may also have white or yellow lumps (tophi) on your hands or feet or in other areas near your joints. How is this treated? Treatment for an acute attack may include medicines for pain and swelling, such as: NSAIDs, such as ibuprofen. Steroids taken by mouth or injected into a joint. Colchicine. This can be given by mouth or through an IV tube. Treatment to prevent future attacks may include: Taking small doses of NSAIDs or colchicine daily. Using a medicine that reduces uric acid levels in your blood, such as allopurinol. Making changes to your diet. You may need to see a food expert (dietitian) about what to eat and drink to prevent gout. Follow these instructions at home: During a gout attack  If told, put ice on the painful area. To do this: Put ice in a plastic bag. Place a towel between your skin and the bag. Leave the ice on for 20 minutes, 2-3 times a day. Take off the ice if your skin turns bright red. This is very important. If you cannot feel pain, heat, or cold, you  have a greater risk of damage to the area. Raise the painful joint above the level of your heart as often as you can. Rest the joint as much as possible. If the joint is in your leg, you may be given crutches. Follow instructions from your doctor about what you cannot eat or drink. Avoiding future gout attacks Eat a low-purine diet. Avoid foods and drinks such as: Liver. Kidney. Anchovies. Asparagus. Herring. Mushrooms. Mussels. Beer. Stay at a healthy weight. If you want to lose weight, talk with your doctor. Do not lose weight too fast. Start or continue an exercise plan as told by your doctor. Eating and drinking Avoid drinks sweetened by fructose. Drink enough fluids to keep your pee (urine) pale yellow. If you drink alcohol: Limit how much you have to: 0-1 drink a day for women who are not pregnant. 0-2 drinks a day for men. Know how much alcohol is in a  drink. In the [place] one drink equals one 12 oz bottle of beer (355 mL), one 5 oz glass of wine (148 mL), or one 1 oz glass of hard liquor (44 mL). General instructions Take over-the-counter and prescription medicines only as told by your doctor. Ask your doctor if you should avoid driving or using machines while you are taking your medicine. Return to your normal activities when your doctor says that it is safe. Keep all follow-up visits. Where to find more information [employer]: www.niams.http://www.myers.net/ Contact a doctor if: You have another gout attack. You still have symptoms of a gout attack after 10 days of treatment. You have problems (side effects) because of your medicines. You have chills or a fever. You have burning pain when you pee (urinate). You have pain in your lower back or belly. Get help right away if: You have very bad pain. Your pain cannot be controlled. You cannot pee. Summary Gout is painful swelling of the joints. The most common site of pain is the big toe, but it can affect other joints. Medicines and avoiding some foods can help to prevent and treat gout attacks. This information is not intended to replace advice given to you by your health care provider. Make sure you discuss any questions you have with your health care provider. Document Revised: 07/06/2021 Document Reviewed: 07/06/2021 Elsevier Patient Education  2024 [employer].

## 2023-07-05 NOTE — Progress Notes (Signed)
Assessment and Plan:  Sean Vasquez was seen today for an episodic visit.  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  - predniSONE (DELTASONE) 10 MG tablet; 1 tab 3 x day for 2 days, then 1 tab 2 x day for 2 days, then 1 tab 1 x day for 3 days  Dispense: 13 tablet; Refill: 0 - doxycycline (VIBRA-TABS) 100 MG tablet; Take 1 tablet (100 mg total) by mouth 2 (two) times daily for 7 days.  Dispense: 14 tablet; Refill: 0  Localized swelling on right hand/erythema Start Prednisone as directed Start abx as directed. RICE Method Check CBC and uric acid  - CBC with Differential/Platelet - Uric acid - predniSONE (DELTASONE) 10 MG tablet; 1 tab 3 x day for 2 days, then 1 tab 2 x day for 2 days, then 1 tab 1 x day for 3 days  Dispense: 13 tablet; Refill: 0 - doxycycline (VIBRA-TABS) 100 MG tablet; Take 1 tablet (100 mg total) by mouth 2 (two) times daily for 7 days.  Dispense: 14 tablet; Refill: 0  Osteoarthritis of first metatarsophalangeal joint  - CBC with Differential/Platelet - Uric acid  Family history of acute gouty arthritis  - Uric acid  Medication management All medications discussed and reviewed in full. All questions and concerns regarding medications addressed.    - CBC with Differential/Platelet - Uric acid - predniSONE (DELTASONE) 10 MG tablet; 1 tab 3 x day for 2 days, then 1 tab 2 x day for 2 days, then 1 tab 1 x day for 3 days  Dispense: 13 tablet; Refill: 0 - doxycycline (VIBRA-TABS) 100 MG tablet; Take 1 tablet (100 mg total) by mouth 2 (two) times daily for 7 days.  Dispense: 14 tablet; Refill: 0  Notify office for further evaluation and treatment, questions or concerns if s/s fail to improve. The risks and benefits of my recommendations, as well as other treatment options were discussed  with the patient today. Questions were answered.  Further disposition pending results of labs. Discussed med's effects and SE's.    Over 30 minutes of exam, counseling, chart review, and critical decision making was performed.   Future Appointments  Date Time Provider Department Center  09/18/2023  3:30 PM Raynelle Dick, NP [facility] None  12/17/2023  3:00 PM Lucky Cowboy, MD [facility] None  03/24/2024  4:00 PM Raynelle Dick, NP [facility] None    ------------------------------------------------------------------------------------------------------------------   HPI BP 136/80   Pulse 71   Temp 98 F (36.7 C)   Resp 16   Ht 5\' 6"  (1.676 m)   Wt 202 lb 9.6 oz (91.9 kg)   SpO2 97%   BMI 32.70 kg/m   62 y.o.male presents for evaluation of right hand pain x2 weeks without NKI, trauma, fall or bug bite.  Associated erythema and edema. Mild streaking. Unalbe to make a full closed fist. He does have a hx of OA and works with his hands daily.  He takes daily ASA.  Not responsive to NSAIDs.  Reports father had gout.  He does not eat red meat, drink beer, eat shellfish.  Denies any recent URI, fever, chills, N/V.    Past Medical History:  Diagnosis Date   Allergy    Hemorrhoids    Hyperlipidemia    Hypertension    Kidney stone    Testosterone  deficiency    Vitamin D deficiency      Allergies  Allergen Reactions   Pravastatin Sodium Other (See Comments)    myalgias   Crestor [Rosuvastatin]     Elevated CPK   Lipitor [Atorvastatin]     Myalgias   Tricor [Fenofibrate]     Myalgias   Zocor [Simvastatin]     Myalgias     Current Outpatient Medications on File Prior to Visit  Medication Sig   aspirin 81 MG chewable tablet Chew 81 mg by mouth daily.   bisoprolol-hydrochlorothiazide (ZIAC) 10-6.25 MG tablet Take 1 tablet by mouth once daily for blood pressure   Cholecalciferol (VITAMIN D PO) Take 2,000 Units by mouth. Takes 8000 units daily   ezetimibe (ZETIA) 10 MG  tablet Take 1 tablet Daily for Cholesterol   fish oil-omega-3 fatty acids 1000 MG capsule Take 1 g by mouth daily.   hydrocortisone (ANUSOL-HC) 2.5 % rectal cream Apply Rectally 3 to 4 x day   Magnesium 500 MG TABS Take 1 tablet by mouth daily.   meclizine (ANTIVERT) 25 MG tablet 1/2-1 pill up to 3 times daily for motion sickness/dizziness   Multiple Vitamin (MULTIVITAMIN) tablet Take 1 tablet by mouth daily.   NEEDLE, DISP, 21 G 21G X 1" MISC Use for testosterone injections, 3 ml   olmesartan (BENICAR) 40 MG tablet Take 1 tablet by mouth once daily   testosterone cypionate (DEPOTESTOSTERONE CYPIONATE) 200 MG/ML injection Inject  1 Ml (200 mg)  into Muscle  every 7 days   zinc gluconate 50 MG tablet Take 50 mg by mouth daily.   No current facility-administered medications on file prior to visit.    ROS: all negative except what is noted in the HPI.   Physical Exam:  BP 136/80   Pulse 71   Temp 98 F (36.7 C)   Resp 16   Ht 5\' 6"  (1.676 m)   Wt 202 lb 9.6 oz (91.9 kg)   SpO2 97%   BMI 32.70 kg/m   General Appearance: NAD.  Awake, conversant and cooperative. Eyes: PERRLA, EOMs intact.  Sclera white.  Conjunctiva without erythema. Sinuses: No frontal/maxillary tenderness.  No nasal discharge. Nares patent.  ENT/Mouth: Ext aud canals clear.  Bilateral TMs w/DOL and without erythema or bulging. Hearing intact.  Posterior pharynx without swelling or exudate.  Tonsils without swelling or erythema.  Neck: Supple.  No masses, nodules or thyromegaly. Respiratory: Effort is regular with non-labored breathing. Breath sounds are equal bilaterally without rales, rhonchi, wheezing or stridor.  Cardio: RRR with no MRGs. Brisk peripheral pulses without edema.  Abdomen: Active BS in all four quadrants.  Soft and non-tender without guarding, rebound tenderness, hernias or masses. Lymphatics: Non tender without lymphadenopathy.  Musculoskeletal: Right second and third metacarpals with moderate  edema, erythema and tenderness to palpation.  Full ROM, 5/5 strength, normal ambulation.  No clubbing or cyanosis. Skin: Appropriate color for ethnicity. Warm without rashes, lesions, ecchymosis, ulcers.  Neuro: CN II-XII grossly normal. Normal muscle tone without cerebellar symptoms and intact sensation.   Psych: AO X 3,  appropriate mood and affect, insight and judgment.     Adela Glimpse, NP 3:45 PM [facility]

## 2023-07-06 LAB — CBC WITH DIFFERENTIAL/PLATELET
Absolute Monocytes: 501 cells/uL (ref 200–950)
Basophils Absolute: 59 cells/uL (ref 0–200)
Basophils Relative: 0.9 %
Eosinophils Absolute: 208 cells/uL (ref 15–500)
Eosinophils Relative: 3.2 %
HCT: 52.2 % — ABNORMAL HIGH (ref 38.5–50.0)
Hemoglobin: 17.6 g/dL — ABNORMAL HIGH (ref 13.2–17.1)
Lymphs Abs: 1892 cells/uL (ref 850–3900)
MCH: 30.1 pg (ref 27.0–33.0)
MCHC: 33.7 g/dL (ref 32.0–36.0)
MCV: 89.2 fL (ref 80.0–100.0)
MPV: 10.8 fL (ref 7.5–12.5)
Monocytes Relative: 7.7 %
Neutro Abs: 3842 cells/uL (ref 1500–7800)
Neutrophils Relative %: 59.1 %
Platelets: 221 10*3/uL (ref 140–400)
RBC: 5.85 10*6/uL — ABNORMAL HIGH (ref 4.20–5.80)
RDW: 12.4 % (ref 11.0–15.0)
Total Lymphocyte: 29.1 %
WBC: 6.5 10*3/uL (ref 3.8–10.8)

## 2023-07-06 LAB — URIC ACID: Uric Acid, Serum: 7.7 mg/dL (ref 4.0–8.0)

## 2023-09-12 ENCOUNTER — Other Ambulatory Visit: Payer: Self-pay | Admitting: Nurse Practitioner

## 2023-09-12 DIAGNOSIS — I10 Essential (primary) hypertension: Secondary | ICD-10-CM

## 2023-09-17 NOTE — Progress Notes (Unsigned)
FOLLOW UP  Assessment and Plan:   Hypertension Acurrently controlled with Ziac 10/6.25 mg every day , olmesartan 40 mg every day  Monitor blood pressure at home; patient to call if consistently greater than 130/80 Continue DASH diet.   Reminder to go to the ER if any CP, SOB, nausea, dizziness, severe HA, changes vision/speech, left arm numbness and tingling and jaw pain.  Cholesterol Currently above goal; hx of statin intolerance with myalgia, SE with fenofibrate Currently on zetia 10 mg every day  Continue omega 3 - suggested he add soluble fiber supplement, discussed LDL goal to maintain <130 with low risk history Continue low cholesterol diet and exercise; weight loss encouraged Check lipid panel.   Other abnormal glucose Recent A1Cs at goal Discussed diet/exercise, weight management  Defer A1C; check CMP  Obesity with co morbidities Long discussion about weight loss, diet, and exercise Recommended diet heavy in fruits and veggies and low in animal meats, cheeses, and dairy products, appropriate calorie intake Discussed ideal weight for height and initial weight goal (<190lb) Make mindful choices during the holidays Will follow up in 3 months  Vitamin D Def At goal at last visit; continue supplementation to maintain goal of 60-100 Defer Vit D level  Hypogonadism - continue to monitor, states medication is helping with symptoms of low T.   Medication management -     CBC with Differential/Platelet -     COMPLETE METABOLIC PANEL WITH GFR -     Lipid panel -     Magnesium  Congestion of ears Use Mucinex and Zyrtec or allegra daily for the next 2-3 weeks Declines steroids  Elevated LFTs Work on diet, exercise and weight loss.  Limit Tylenol and alcohol.  - CMP  Elevated hemoglobin (HCC) -     CBC with Differential/Platelet     Continue diet and meds as discussed. Further disposition pending results of labs. Discussed med's effects and SE's.   Over 30 minutes  of exam, counseling, chart review, and critical decision making was performed.   Future Appointments  Date Time Provider Department Center  12/17/2023  3:00 PM Lucky Cowboy, MD [facility] None  03/24/2024  4:00 PM Raynelle Dick, NP [facility] None    ----------------------------------------------------------------------------------------------------------------------  HPI 62 y.o. male  presents for 3 month follow up on hypertension, cholesterol, glucose management, obesity, hypogonadism and vitamin D deficiency.   He has been noticing some congestion and wheezing.  Has tried Mucinex. He had a productive cough of yellow mucus.  He has been having wheezing at night.   BMI is Body mass index is 33.25 kg/m., he has been working on diet, reducing red meat and dairy, works a physically intense job.  Wt Readings from Last 3 Encounters:  09/18/23 206 lb (93.4 kg)  07/05/23 202 lb 9.6 oz (91.9 kg)  06/19/23 202 lb 9.6 oz (91.9 kg)   His blood pressure has been controlled at home (120-128/70-80s, checks 3-4 days a week), currently on Ziac 10/6.25 mg every day and Olmesartan 40 mg every day today their BP is BP: 138/78  BP Readings from Last 3 Encounters:  09/18/23 138/78  07/05/23 136/80  06/19/23 138/84   He does not workout but works physically intense job. He denies chest pain, shortness of breath, dizziness.   He is on cholesterol medication (zetia 10 mg daily.hx of intolerance of multiple statins and fenofibrate) and denies myalgias. He is on a omega 3 supplement. He has cut out red meat, milk, ice cream and eggs.  His cholesterol is not at goal. The cholesterol last visit was:   Lab Results  Component Value Date   CHOL 160 06/19/2023   HDL 26 (L) 06/19/2023   LDLCALC  06/19/2023     Comment:     . LDL cholesterol not calculated. Triglyceride levels greater than 400 mg/dL invalidate calculated LDL results. . Reference range: <100 . Desirable range <100 mg/dL for primary  prevention;   <70 mg/dL for patients with CHD or diabetic patients  with > or = 2 CHD risk factors. Marland Kitchen LDL-C is now calculated using the Martin-Hopkins  calculation, which is a validated novel method providing  better accuracy than the Friedewald equation in the  estimation of LDL-C.  Horald Pollen et al. Lenox Ahr. 5643;329(51): 2061-2068  (http://education.[employer].com/faq/FAQ164)    TRIG 434 (H) 06/19/2023   CHOLHDL 6.2 (H) 06/19/2023    He has been working on diet and exercise for glucose management, and denies foot ulcerations, increased appetite, nausea, paresthesia of the feet, polydipsia, polyuria, visual disturbances and vomiting. Last A1C in the office was:  Lab Results  Component Value Date   HGBA1C 5.8 (H) 06/19/2023    He is trying to drink more. Last GFR:  Lab Results  Component Value Date   EGFR 65 06/19/2023    Patient is on Vitamin D supplement.   Lab Results  Component Value Date   VD25OH 76 06/19/2023     He has a history of testosterone deficiency and is on testosterone replacement, taking 100 mg weekly. He states that the testosterone helps with his energy, libido, muscle mass. Last injection 6 days a week. Lab Results  Component Value Date   TESTOSTERONE 666 06/19/2023      Current Medications:  Current Outpatient Medications on File Prior to Visit  Medication Sig   aspirin 81 MG chewable tablet Chew 81 mg by mouth daily.   bisoprolol-hydrochlorothiazide (ZIAC) 10-6.25 MG tablet Take 1 tablet by mouth once daily for blood pressure   Cholecalciferol (VITAMIN D PO) Take 2,000 Units by mouth. Takes 8000 units daily   ezetimibe (ZETIA) 10 MG tablet Take 1 tablet Daily for Cholesterol   fish oil-omega-3 fatty acids 1000 MG capsule Take 1 g by mouth daily.   Flaxseed, Linseed, (FLAX SEED OIL PO) Take by mouth.   guaiFENesin (MUCINEX PO) Take by mouth.   hydrocortisone (ANUSOL-HC) 2.5 % rectal cream Apply Rectally 3 to 4 x day   Magnesium 500 MG TABS Take  1 tablet by mouth daily.   meclizine (ANTIVERT) 25 MG tablet 1/2-1 pill up to 3 times daily for motion sickness/dizziness   Multiple Vitamin (MULTIVITAMIN) tablet Take 1 tablet by mouth daily.   NEEDLE, DISP, 21 G 21G X 1" MISC Use for testosterone injections, 3 ml   olmesartan (BENICAR) 40 MG tablet Take 1 tablet by mouth once daily   testosterone cypionate (DEPOTESTOSTERONE CYPIONATE) 200 MG/ML injection Inject  1 Ml (200 mg)  into Muscle  every 7 days   zinc gluconate 50 MG tablet Take 50 mg by mouth daily.   predniSONE (DELTASONE) 10 MG tablet 1 tab 3 x day for 2 days, then 1 tab 2 x day for 2 days, then 1 tab 1 x day for 3 days (Patient not taking: Reported on 09/18/2023)   No current facility-administered medications on file prior to visit.     Allergies:  Allergies  Allergen Reactions   Pravastatin Sodium Other (See Comments)    myalgias   Crestor [Rosuvastatin]  Elevated CPK   Lipitor [Atorvastatin]     Myalgias   Tricor [Fenofibrate]     Myalgias   Zocor [Simvastatin]     Myalgias      Medical History:  Past Medical History:  Diagnosis Date   Allergy    Hemorrhoids    Hyperlipidemia    Hypertension    Kidney stone    Testosterone deficiency    Vitamin D deficiency    Family history- Reviewed and unchanged Social history- Reviewed and unchanged   Review of Systems:  Review of Systems  Constitutional:  Negative for malaise/fatigue and weight loss.  HENT:  Positive for congestion. Negative for hearing loss and tinnitus.   Eyes:  Negative for blurred vision and double vision.  Respiratory:  Positive for cough. Negative for shortness of breath and wheezing.   Cardiovascular:  Negative for chest pain, palpitations, orthopnea, claudication and leg swelling.  Gastrointestinal:  Negative for abdominal pain, blood in stool, constipation, diarrhea, heartburn, melena, nausea and vomiting.  Genitourinary: Negative.   Musculoskeletal:  Negative for joint pain and  myalgias.  Skin:  Negative for rash.  Neurological:  Negative for dizziness, tingling, sensory change, weakness and headaches.  Endo/Heme/Allergies:  Negative for polydipsia.  Psychiatric/Behavioral: Negative.    All other systems reviewed and are negative.     Physical Exam: BP 138/78   Pulse 78   Temp 97.7 F (36.5 C)   Ht 5\' 6"  (1.676 m)   Wt 206 lb (93.4 kg)   SpO2 97%   BMI 33.25 kg/m  Wt Readings from Last 3 Encounters:  09/18/23 206 lb (93.4 kg)  07/05/23 202 lb 9.6 oz (91.9 kg)  06/19/23 202 lb 9.6 oz (91.9 kg)   General Appearance: Well nourished, in no apparent distress. Eyes: PERRLA, EOMs, conjunctiva no swelling or erythema Sinuses: No Frontal/maxillary tenderness ENT/Mouth: Ext aud canals obstructed by hair growth TMs show no bulging or erythema. Mask in place; oral exam deferred. Hearing normal.  Neck: Supple, thyroid normal.  Respiratory: Respiratory effort normal, BS equal bilaterally without rales, rhonchi, wheezing or stridor.  Cardio: RRR with no MRGs. Brisk peripheral pulses without edema.  Abdomen: Soft, + BS.  Non tender, no guarding, rebound, hernias, masses. Lymphatics: Non tender without lymphadenopathy.  Musculoskeletal: Full ROM, 5/5 strength, Normal gait Skin: Warm, dry without rashes, lesions, ecchymosis.  Neuro: Cranial nerves intact. No cerebellar symptoms.  Psych: Awake and oriented X 3, normal affect, Insight and Judgment appropriate.    Raynelle Dick, NP 3:52 PM [facility]

## 2023-09-18 ENCOUNTER — Encounter: Payer: Self-pay | Admitting: Nurse Practitioner

## 2023-09-18 ENCOUNTER — Ambulatory Visit: Payer: Managed Care, Other (non HMO) | Admitting: Nurse Practitioner

## 2023-09-18 VITALS — BP 138/78 | HR 78 | Temp 97.7°F | Ht 66.0 in | Wt 206.0 lb

## 2023-09-18 DIAGNOSIS — Z79899 Other long term (current) drug therapy: Secondary | ICD-10-CM | POA: Diagnosis not present

## 2023-09-18 DIAGNOSIS — E782 Mixed hyperlipidemia: Secondary | ICD-10-CM

## 2023-09-18 DIAGNOSIS — R7989 Other specified abnormal findings of blood chemistry: Secondary | ICD-10-CM

## 2023-09-18 DIAGNOSIS — E66811 Obesity, class 1: Secondary | ICD-10-CM

## 2023-09-18 DIAGNOSIS — I10 Essential (primary) hypertension: Secondary | ICD-10-CM

## 2023-09-18 DIAGNOSIS — E559 Vitamin D deficiency, unspecified: Secondary | ICD-10-CM

## 2023-09-18 DIAGNOSIS — R7309 Other abnormal glucose: Secondary | ICD-10-CM | POA: Diagnosis not present

## 2023-09-18 DIAGNOSIS — E291 Testicular hypofunction: Secondary | ICD-10-CM

## 2023-09-18 DIAGNOSIS — H938X3 Other specified disorders of ear, bilateral: Secondary | ICD-10-CM

## 2023-09-18 DIAGNOSIS — D582 Other hemoglobinopathies: Secondary | ICD-10-CM

## 2023-09-18 NOTE — Patient Instructions (Signed)

## 2023-09-19 LAB — CBC WITH DIFFERENTIAL/PLATELET
Absolute Lymphocytes: 2210 {cells}/uL (ref 850–3900)
Absolute Monocytes: 576 {cells}/uL (ref 200–950)
Basophils Absolute: 58 {cells}/uL (ref 0–200)
Basophils Relative: 0.8 %
Eosinophils Absolute: 266 {cells}/uL (ref 15–500)
Eosinophils Relative: 3.7 %
HCT: 47.1 % (ref 38.5–50.0)
Hemoglobin: 16.2 g/dL (ref 13.2–17.1)
MCH: 29.8 pg (ref 27.0–33.0)
MCHC: 34.4 g/dL (ref 32.0–36.0)
MCV: 86.6 fL (ref 80.0–100.0)
MPV: 10.8 fL (ref 7.5–12.5)
Monocytes Relative: 8 %
Neutro Abs: 4090 {cells}/uL (ref 1500–7800)
Neutrophils Relative %: 56.8 %
Platelets: 247 10*3/uL (ref 140–400)
RBC: 5.44 10*6/uL (ref 4.20–5.80)
RDW: 12.9 % (ref 11.0–15.0)
Total Lymphocyte: 30.7 %
WBC: 7.2 10*3/uL (ref 3.8–10.8)

## 2023-09-19 LAB — COMPLETE METABOLIC PANEL WITH GFR
AG Ratio: 2 (calc) (ref 1.0–2.5)
ALT: 47 U/L — ABNORMAL HIGH (ref 9–46)
AST: 29 U/L (ref 10–35)
Albumin: 4.4 g/dL (ref 3.6–5.1)
Alkaline phosphatase (APISO): 59 U/L (ref 35–144)
BUN: 16 mg/dL (ref 7–25)
CO2: 30 mmol/L (ref 20–32)
Calcium: 9.7 mg/dL (ref 8.6–10.3)
Chloride: 101 mmol/L (ref 98–110)
Creat: 1.16 mg/dL (ref 0.70–1.35)
Globulin: 2.2 g/dL (ref 1.9–3.7)
Glucose, Bld: 95 mg/dL (ref 65–99)
Potassium: 4.5 mmol/L (ref 3.5–5.3)
Sodium: 138 mmol/L (ref 135–146)
Total Bilirubin: 0.4 mg/dL (ref 0.2–1.2)
Total Protein: 6.6 g/dL (ref 6.1–8.1)
eGFR: 72 mL/min/{1.73_m2} (ref >=60)

## 2023-09-19 LAB — LIPID PANEL
Cholesterol: 159 mg/dL (ref <200)
HDL: 25 mg/dL — ABNORMAL LOW (ref >=40)
Non-HDL Cholesterol (Calc): 134 mg/dL — ABNORMAL HIGH (ref <130)
Total CHOL/HDL Ratio: 6.4 (calc) — ABNORMAL HIGH (ref <5.0)
Triglycerides: 442 mg/dL — ABNORMAL HIGH (ref <150)

## 2023-09-19 LAB — MAGNESIUM: Magnesium: 1.8 mg/dL (ref 1.5–2.5)

## 2023-10-03 ENCOUNTER — Other Ambulatory Visit: Payer: Self-pay | Admitting: Internal Medicine

## 2023-10-03 DIAGNOSIS — E349 Endocrine disorder, unspecified: Secondary | ICD-10-CM

## 2023-10-03 MED ORDER — TESTOSTERONE CYPIONATE 200 MG/ML IM SOLN: INTRAMUSCULAR | 1 refills | Status: AC

## 2023-10-15 NOTE — Progress Notes (Signed)
 Assessment and Plan:  Sean Vasquez was seen today for an episodic visit.  Diagnoses and all order for this visit:  1. Encounter for screening for COVID-19 (Primary) Negative  - POC COVID-19  2. Acute non-recurrent frontal sinusitis Start Zpak as directed Stay well hydrated to keep mucus thin and productive.  - azithromycin  (ZITHROMAX ) 250 MG tablet; Take 2 tablets on  Day 1,  followed by 1 tablet  daily for 4 more days    for Sinusitis  /Bronchitis  Dispense: 6 each; Refill: 1  Essential hypertension Elevated in clinic. Discussed DASH (Dietary Approaches to Stop Hypertension) DASH diet is lower in sodium than a typical American diet. Cut back on foods that are high in saturated fat, cholesterol, and trans fats. Eat more whole-grain foods, fish, poultry, and nuts Remain active and exercise as tolerated daily.  Monitor BP at home-Call if greater than 130/80.   - olmesartan  (BENICAR ) 40 MG tablet; Take 1 tablet by mouth once daily  Dispense: 90 tablet; Refill: 2  Acute cough Start promethazine  cough syrup Stay well hydrated to keep any mucus thin an d productive. Coughing can be cuased by several factors including   breathing in things that bother (irritate) your lungs.  Allergies.  Asthma.  Mucus that runs down the back of your throat (postnasal drip).  Smoking/smoke.  Acid backing up from the stomach into the tube that moves food from the mouth to the stomach (gastroesophageal reflux). A cough can linger for 3 weeks. Watch for any changes in your cough and contact office if noticed including blood, pus, pain, night sweats. Cover your mouth when you cough. If the air is dry, use a cool mist vaporizer or humidifier in your home. If your cough is worse at night, try using extra pillows to raise your head up higher while you sleep. Call 911 or report to ER if you start to have difficulty breathing.   - promethazine -dextromethorphan (PROMETHAZINE -DM) 6.25-15 MG/5ML syrup;  Take 5 mLs by mouth 4 (four) times daily as needed for cough.  Dispense: 240 mL; Refill: 0  Wheezing Start steroid taper. Continue to monitor  - dexamethasone  (DECADRON ) 4 MG tablet; Take 1 tab 3 x /day for 2 days, then 2 x /day for 2  Days,  then 1 tab daily  Dispense: 13 tablet; Refill: 0  Notify office for further evaluation and treatment, questions or concerns if s/s fail to improve. The risks and benefits of my recommendations, as well as other treatment options were discussed with the patient today. Questions were answered.  Further disposition pending results of labs. Discussed med's effects and SE's.    Over 20 minutes of exam, counseling, chart review, and critical decision making was performed.   Future Appointments  Date Time Provider Department Center  12/17/2023  3:00 PM Tonita Fallow, MD [facility] None  03/24/2024  4:00 PM Jude Lonell BRAVO, NP [facility] None    ------------------------------------------------------------------------------------------------------------------   HPI BP (!) 152/100   Pulse 69   Temp 98.1 F (36.7 C)   Ht 5' 6 (1.676 m)   Wt 209 lb (94.8 kg)   SpO2 98%   BMI 33.73 kg/m    Patient complains of symptoms of a URI, possible sinusitis. Symptoms include congestion, cough described as productive, headache described as throbbing, nasal congestion, sinus pressure, and wheezing. Onset of symptoms was 1 week ago, and has been unchanged since that time. Treatment to date: antihistamines and decongestants.  BP is elevated in clinic today.  He  is asymptomatic.  Denies chest pain, heart palpitations.   BP Readings from Last 3 Encounters:  10/16/23 (!) 152/100  09/18/23 138/78  07/05/23 136/80    Past Medical History:  Diagnosis Date   Allergy    Hemorrhoids    Hyperlipidemia    Hypertension    Kidney stone    Testosterone  deficiency    Vitamin D  deficiency      Allergies  Allergen Reactions   Pravastatin  Sodium Other (See  Comments)    myalgias   Crestor [Rosuvastatin]     Elevated CPK   Lipitor [Atorvastatin]     Myalgias   Tricor [Fenofibrate]     Myalgias   Zocor [Simvastatin]     Myalgias     Current Outpatient Medications on File Prior to Visit  Medication Sig   aspirin 81 MG chewable tablet Chew 81 mg by mouth daily.   bisoprolol -hydrochlorothiazide  (ZIAC ) 10-6.25 MG tablet Take 1 tablet by mouth once daily for blood pressure   Cholecalciferol (VITAMIN D  PO) Take 2,000 Units by mouth. Takes 8000 units daily   ezetimibe  (ZETIA ) 10 MG tablet Take 1 tablet Daily for Cholesterol   fish oil-omega-3 fatty acids 1000 MG capsule Take 1 g by mouth daily.   Flaxseed, Linseed, (FLAX SEED OIL PO) Take by mouth.   guaiFENesin (MUCINEX PO) Take by mouth.   hydrocortisone  (ANUSOL -HC) 2.5 % rectal cream Apply Rectally 3 to 4 x day   Magnesium 500 MG TABS Take 1 tablet by mouth daily.   meclizine  (ANTIVERT ) 25 MG tablet 1/2-1 pill up to 3 times daily for motion sickness/dizziness   Multiple Vitamin (MULTIVITAMIN) tablet Take 1 tablet by mouth daily.   NEEDLE, DISP, 21 G 21G X 1 MISC Use for testosterone  injections, 3 ml   testosterone  cypionate (DEPOTESTOSTERONE CYPIONATE) 200 MG/ML injection Inject  1 Ml (200 mg)  into Muscle  every 7 days   zinc gluconate 50 MG tablet Take 50 mg by mouth daily.   No current facility-administered medications on file prior to visit.    ROS: all negative except what is noted in the HPI.   Physical Exam:  BP (!) 152/100   Pulse 69   Temp 98.1 F (36.7 C)   Ht 5' 6 (1.676 m)   Wt 209 lb (94.8 kg)   SpO2 98%   BMI 33.73 kg/m   General Appearance: NAD.  Awake, conversant and cooperative. Eyes: PERRLA, EOMs intact.  Sclera white.  Conjunctiva without erythema. Sinuses: Frontal/maxillary tenderness.  No nasal discharge. Nares patent.  ENT/Mouth: Ext aud canals clear.  Bilateral TMs w/DOL and without erythema or bulging. Hearing intact.  Posterior pharynx without  swelling or exudate.  Tonsils without swelling or erythema.  Neck: Supple.  No masses, nodules or thyromegaly. Respiratory: Effort is regular with non-labored breathing. Breath sounds are equal bilaterally without rales, rhonchi, wheezing or stridor.  Cardio: RRR with no MRGs. Brisk peripheral pulses without edema.  Abdomen: Active BS in all four quadrants.  Soft and non-tender without guarding, rebound tenderness, hernias or masses. Lymphatics: Non tender without lymphadenopathy.  Musculoskeletal: Full ROM, 5/5 strength, normal ambulation.  No clubbing or cyanosis. Skin: Appropriate color for ethnicity. Warm without rashes, lesions, ecchymosis, ulcers.  Neuro: CN II-XII grossly normal. Normal muscle tone without cerebellar symptoms and intact sensation.   Psych: AO X 3,  appropriate mood and affect, insight and judgment.     BASCOM NECESSARY, NP 4:28 PM [facility]

## 2023-10-16 ENCOUNTER — Encounter: Payer: Self-pay | Admitting: Nurse Practitioner

## 2023-10-16 ENCOUNTER — Ambulatory Visit: Payer: Managed Care, Other (non HMO) | Admitting: Nurse Practitioner

## 2023-10-16 ENCOUNTER — Other Ambulatory Visit: Payer: Self-pay

## 2023-10-16 VITALS — BP 152/100 | HR 69 | Temp 98.1°F | Ht 66.0 in | Wt 209.0 lb

## 2023-10-16 DIAGNOSIS — J0110 Acute frontal sinusitis, unspecified: Secondary | ICD-10-CM

## 2023-10-16 DIAGNOSIS — Z1152 Encounter for screening for COVID-19: Secondary | ICD-10-CM | POA: Diagnosis not present

## 2023-10-16 DIAGNOSIS — R051 Acute cough: Secondary | ICD-10-CM

## 2023-10-16 DIAGNOSIS — I10 Essential (primary) hypertension: Secondary | ICD-10-CM

## 2023-10-16 DIAGNOSIS — R062 Wheezing: Secondary | ICD-10-CM

## 2023-10-16 LAB — POC COVID19 BINAXNOW: SARS Coronavirus 2 Ag: NEGATIVE

## 2023-10-16 MED ORDER — OLMESARTAN MEDOXOMIL 40 MG PO TABS: ORAL_TABLET | ORAL | 2 refills | Status: AC

## 2023-10-16 MED ORDER — PROMETHAZINE-DM 6.25-15 MG/5ML PO SYRP: 5.0000 mL | ORAL_SOLUTION | Freq: Four times a day (QID) | ORAL | 0 refills | Status: AC | PRN

## 2023-10-16 MED ORDER — DEXAMETHASONE 4 MG PO TABS: ORAL_TABLET | ORAL | 0 refills | Status: AC

## 2023-10-16 MED ORDER — AZITHROMYCIN 250 MG PO TABS: ORAL_TABLET | ORAL | 1 refills | Status: AC

## 2023-10-16 NOTE — Patient Instructions (Signed)

## 2023-11-27 ENCOUNTER — Encounter: Payer: Self-pay | Admitting: *Deleted

## 2023-12-11 ENCOUNTER — Other Ambulatory Visit: Payer: Self-pay

## 2023-12-11 DIAGNOSIS — I10 Essential (primary) hypertension: Secondary | ICD-10-CM

## 2023-12-11 MED ORDER — BISOPROLOL-HYDROCHLOROTHIAZIDE 10-6.25 MG PO TABS: ORAL_TABLET | ORAL | 0 refills | Status: AC

## 2023-12-12 ENCOUNTER — Encounter: Payer: Managed Care, Other (non HMO) | Admitting: Internal Medicine

## 2023-12-17 ENCOUNTER — Encounter: Payer: Managed Care, Other (non HMO) | Admitting: Internal Medicine

## 2023-12-20 ENCOUNTER — Encounter: Payer: Self-pay | Admitting: *Deleted

## 2024-03-20 ENCOUNTER — Other Ambulatory Visit: Payer: Self-pay | Admitting: Family

## 2024-03-20 DIAGNOSIS — I10 Essential (primary) hypertension: Secondary | ICD-10-CM

## 2024-03-24 ENCOUNTER — Ambulatory Visit: Payer: Managed Care, Other (non HMO) | Admitting: Nurse Practitioner
